# Patient Record
Sex: Male | Born: 1965 | Race: Black or African American | Hispanic: No | Marital: Single | Smoking: Never smoker
Health system: Southern US, Community
[De-identification: ages and names within clinical notes are randomized; demographics above are authoritative.]

## PROBLEM LIST (undated history)

## (undated) ENCOUNTER — Ambulatory Visit (HOSPITAL_COMMUNITY): Admission: EM

## (undated) ENCOUNTER — Emergency Department (HOSPITAL_COMMUNITY): Admission: EM | Payer: Self-pay

## (undated) DIAGNOSIS — N529 Male erectile dysfunction, unspecified: Secondary | ICD-10-CM

## (undated) DIAGNOSIS — M109 Gout, unspecified: Secondary | ICD-10-CM

## (undated) DIAGNOSIS — I10 Essential (primary) hypertension: Secondary | ICD-10-CM

## (undated) DIAGNOSIS — E785 Hyperlipidemia, unspecified: Secondary | ICD-10-CM

## (undated) HISTORY — PX: FOOT SURGERY: SHX648

## (undated) HISTORY — DX: Male erectile dysfunction, unspecified: N52.9

## (undated) HISTORY — PX: QUADRICEPS TENDON REPAIR: SHX756

## (undated) HISTORY — DX: Hyperlipidemia, unspecified: E78.5

## (undated) HISTORY — DX: Gout, unspecified: M10.9

---

## 2000-02-12 ENCOUNTER — Other Ambulatory Visit: Admission: RE | Admit: 2000-02-12 | Discharge: 2000-02-12 | Payer: Self-pay | Admitting: Podiatry

## 2007-01-25 ENCOUNTER — Ambulatory Visit (HOSPITAL_COMMUNITY): Admission: RE | Admit: 2007-01-25 | Discharge: 2007-01-25 | Payer: Self-pay | Admitting: Family Medicine

## 2007-01-25 ENCOUNTER — Emergency Department (HOSPITAL_COMMUNITY): Admission: EM | Admit: 2007-01-25 | Discharge: 2007-01-25 | Payer: Self-pay | Admitting: Family Medicine

## 2012-03-10 ENCOUNTER — Encounter (HOSPITAL_COMMUNITY): Payer: Self-pay | Admitting: *Deleted

## 2012-03-10 ENCOUNTER — Emergency Department (HOSPITAL_COMMUNITY)
Admission: EM | Admit: 2012-03-10 | Discharge: 2012-03-10 | Disposition: A | Discharge status: Home / Self Care | Payer: Managed Care, Other (non HMO) | Source: Home / Self Care | Attending: Emergency Medicine | Admitting: Emergency Medicine

## 2012-03-10 DIAGNOSIS — M109 Gout, unspecified: Secondary | ICD-10-CM

## 2012-03-10 HISTORY — DX: Essential (primary) hypertension: I10

## 2012-03-10 MED ORDER — INDOMETHACIN 50 MG PO CAPS: 50.0000 mg | ORAL_CAPSULE | Freq: Three times a day (TID) | ORAL | Status: DC

## 2012-03-10 NOTE — ED Provider Notes (Signed)
Chief Complaint  Patient presents with  . Foot Pain    History of Present Illness:   Gary Villanueva is a 46 year old male with an 8-9 year history of gout. His attacks occur rarely. He's not taking any long-term uric acid lowering medication. It's been years since his last attack. The current attack began yesterday and involves his right ankle. There is swelling but not much redness or heat. It's painful to touch and to move. He also has pain with ambulation. There were no obvious precipitating factors such as trauma or dietary indiscretion. He denies any fever, chills, or other joint pain. He has had good luck with indomethacin in the past.  He also has high blood pressure and ran out of his medication yesterday. He was not sure the name of the medication.  Review of Systems:  Other than noted above, the patient denies any of the following symptoms: Systemic:  No fevers, chills, sweats, or aches.  No fatigue or tiredness. Musculoskeletal:  No joint pain, arthritis, bursitis, swelling, back pain, or neck pain. Neurological:  No muscular weakness, paresthesias, headache, or trouble with speech or coordination.  No dizziness.  PMFSH:  Past medical history, family history, social history, meds, and allergies were reviewed.  Physical Exam:   Vital signs:  BP 156/88  Pulse 70  Temp 98.6 F (37 C) (Oral)  Resp 20  SpO2 100% Gen:  Alert and oriented times 3.  In no distress. Musculoskeletal: The right ankle was slightly swollen and tender to palpation but there was no redness or heat. There was pain on movement but a full range of motion. Otherwise, all joints had a full a ROM with no swelling, bruising or deformity.  No edema, pulses full. Extremities were warm and pink.  Capillary refill was brisk.  Skin:  Clear, warm and dry.  No rash. Neuro:  Alert and oriented times 3.  Muscle strength was normal.  Sensation was intact to light touch.   Assessment:  The primary encounter diagnosis was Gout attack.  A diagnosis of Elevated blood pressure was also pertinent to this visit.  Plan:   1.  The following meds were prescribed:   New Prescriptions   INDOMETHACIN (INDOCIN) 50 MG CAPSULE    Take 1 capsule (50 mg total) by mouth 3 (three) times daily with meals.   2.  The patient was instructed in symptomatic care, including rest and activity, elevation, application of ice and compression.  Appropriate handouts were given. 3.  The patient was told to return if becoming worse in any way, if no better in 3 or 4 days, and given some red flag symptoms that would indicate earlier return.   4.  The patient was told to follow up with his primary care practice for a refill on his blood pressure medication.    Reuben Likes, MD 03/10/12 [phone]

## 2012-03-10 NOTE — ED Notes (Signed)
Pt  Reports  Pain  r  Foot  He  Reports he  Has a  History  Of gout      -  He  denys  Any  Recent  Injury  - the  Pain is  Mainly in the  r  Ankle  Area    He  Is  Using  Crutches  At this  Time         He  Reports a  History of  Hypertension  yest he  Takes  No meds    For  It

## 2014-06-21 ENCOUNTER — Encounter (HOSPITAL_COMMUNITY): Payer: Self-pay | Admitting: Emergency Medicine

## 2014-06-21 ENCOUNTER — Emergency Department (HOSPITAL_COMMUNITY)
Admission: EM | Admit: 2014-06-21 | Discharge: 2014-06-21 | Disposition: A | Discharge status: Home / Self Care | Payer: Managed Care, Other (non HMO) | Source: Home / Self Care | Attending: Family Medicine | Admitting: Family Medicine

## 2014-06-21 DIAGNOSIS — I10 Essential (primary) hypertension: Secondary | ICD-10-CM | POA: Diagnosis not present

## 2014-06-21 MED ORDER — LISINOPRIL-HYDROCHLOROTHIAZIDE 10-12.5 MG PO TABS: 1.0000 | ORAL_TABLET | Freq: Every day | ORAL | Status: DC

## 2014-06-21 NOTE — ED Notes (Signed)
Reports BP has been high lately BP today = 169/100; has not had BP med 2-3 months Sx today include: dizziness and feeling light headed Denies CP, SOB, weakness Alert, no signs of acute distress.

## 2014-06-21 NOTE — ED Provider Notes (Signed)
CSN: [id number]     Arrival date & time 06/21/14  1609 History   First MD Initiated Contact with Patient 06/21/14 1742     Chief Complaint  Patient presents with  . Hypertension   (Consider location/radiation/quality/duration/timing/severity/associated sxs/prior Treatment) Patient is a 49 y.o. male presenting with hypertension. The history is provided by the patient.  Hypertension This is a chronic (on meds for approx 7 mo.) problem. Pertinent negatives include no chest pain, no abdominal pain, no headaches and no shortness of breath.    Past Medical History  Diagnosis Date  . Hypertension    Past Surgical History  Procedure Laterality Date  . Foot surgery     No family history on file. History  Substance Use Topics  . Smoking status: Not on file  . Smokeless tobacco: Not on file  . Alcohol Use: Yes    Review of Systems  Constitutional: Negative.   Respiratory: Negative for shortness of breath.   Cardiovascular: Negative for chest pain and leg swelling.  Gastrointestinal: Negative for abdominal pain.  Neurological: Negative for headaches.    Allergies  Review of patient's allergies indicates no known allergies.  Home Medications   Prior to Admission medications   Medication Sig Start Date End Date Taking? Authorizing Provider  indomethacin (INDOCIN) 50 MG capsule Take 1 capsule (50 mg total) by mouth 3 (three) times daily with meals. 03/10/12   Reuben Likesavid C Keller, MD  lisinopril-hydrochlorothiazide (PRINZIDE,ZESTORETIC) 10-12.5 MG per tablet Take 1 tablet by mouth daily. 06/21/14   Linna HoffJames D Curley Hogen, MD   BP 169/100 mmHg  Pulse 61  Temp(Src) 97.9 F (36.6 C) (Oral)  Resp 18  SpO2 96% Physical Exam  Constitutional: He is oriented to person, place, and time. He appears well-developed and well-nourished. No distress.  HENT:  Head: Normocephalic.  Eyes: Conjunctivae are normal. Pupils are equal, round, and reactive to light.  Neck: Normal range of motion. Neck supple.   Cardiovascular: Normal rate, regular rhythm, normal heart sounds and intact distal pulses.   Pulmonary/Chest: Effort normal and breath sounds normal.  Abdominal: Soft. Bowel sounds are normal.  Lymphadenopathy:    He has no cervical adenopathy.  Neurological: He is alert and oriented to person, place, and time.  Skin: Skin is warm and dry.  Nursing note and vitals reviewed.   ED Course  Procedures (including critical care time) Labs Review Labs Reviewed - No data to display  Imaging Review No results found.   MDM   1. Essential hypertension        Linna HoffJames D Denorris Reust, MD 06/21/14 1754

## 2014-06-21 NOTE — Discharge Instructions (Signed)
Take pill daily and see your doctor for further refills.

## 2015-09-27 ENCOUNTER — Ambulatory Visit (HOSPITAL_COMMUNITY)
Admission: EM | Admit: 2015-09-27 | Discharge: 2015-09-27 | Disposition: A | Discharge status: Home / Self Care | Payer: Managed Care, Other (non HMO) | Attending: Family Medicine | Admitting: Family Medicine

## 2015-09-27 ENCOUNTER — Encounter (HOSPITAL_COMMUNITY): Payer: Self-pay

## 2015-09-27 DIAGNOSIS — I10 Essential (primary) hypertension: Secondary | ICD-10-CM | POA: Diagnosis not present

## 2015-09-27 DIAGNOSIS — Z79899 Other long term (current) drug therapy: Secondary | ICD-10-CM | POA: Diagnosis not present

## 2015-09-27 DIAGNOSIS — Z202 Contact with and (suspected) exposure to infections with a predominantly sexual mode of transmission: Secondary | ICD-10-CM | POA: Diagnosis not present

## 2015-09-27 DIAGNOSIS — Z711 Person with feared health complaint in whom no diagnosis is made: Secondary | ICD-10-CM

## 2015-09-27 LAB — POCT URINALYSIS DIP (DEVICE)
Bilirubin Urine: NEGATIVE
Glucose, UA: NEGATIVE mg/dL
HGB URINE DIPSTICK: NEGATIVE
KETONES UR: NEGATIVE mg/dL
Leukocytes, UA: NEGATIVE
Nitrite: NEGATIVE
PH: 5.5 (ref 5.0–8.0)
PROTEIN: NEGATIVE mg/dL
SPECIFIC GRAVITY, URINE: 1.02 (ref 1.005–1.030)
Urobilinogen, UA: 0.2 mg/dL (ref 0.0–1.0)

## 2015-09-27 MED ORDER — METRONIDAZOLE 500 MG PO TABS: 500.0000 mg | ORAL_TABLET | Freq: Two times a day (BID) | ORAL | Status: DC

## 2015-09-27 NOTE — Discharge Instructions (Signed)
Use medicine as prescribed if desired , we will call if tests show a need for other treatment.

## 2015-09-27 NOTE — ED Provider Notes (Signed)
CSN: [id number]     Arrival date & time 09/27/15  1525 History   First MD Initiated Contact with Patient 09/27/15 1559     Chief Complaint  Patient presents with  . Exposure to STD   (Consider location/radiation/quality/duration/timing/severity/associated sxs/prior Treatment) Patient is a 50 y.o. male presenting with STD exposure. The history is provided by the patient.  Exposure to STD This is a new problem. The current episode started 2 days ago (advised of poss trich by male contact. pt denies any sx.). The problem has not changed since onset.Pertinent negatives include no abdominal pain.    Past Medical History  Diagnosis Date  . Hypertension    Past Surgical History  Procedure Laterality Date  . Foot surgery     No family history on file. Social History  Substance Use Topics  . Smoking status: Never Smoker   . Smokeless tobacco: Never Used  . Alcohol Use: Yes     Comment: occ    Review of Systems  Constitutional: Negative.   Gastrointestinal: Negative.  Negative for abdominal pain.  Genitourinary: Negative for dysuria, frequency, discharge, penile swelling, scrotal swelling, penile pain and testicular pain.  All other systems reviewed and are negative.   Allergies  Review of patient's allergies indicates no known allergies.  Home Medications   Prior to Admission medications   Medication Sig Start Date End Date Taking? Authorizing Provider  lisinopril-hydrochlorothiazide (PRINZIDE,ZESTORETIC) 10-12.5 MG per tablet Take 1 tablet by mouth daily. 06/21/14  Yes Linna HoffJames D Kenley Rettinger, MD  indomethacin (INDOCIN) 50 MG capsule Take 1 capsule (50 mg total) by mouth 3 (three) times daily with meals. 03/10/12   Reuben Likesavid C Keller, MD  metroNIDAZOLE (FLAGYL) 500 MG tablet Take 1 tablet (500 mg total) by mouth 2 (two) times daily. 09/27/15   Linna HoffJames D Merie Wulf, MD   Meds Ordered and Administered this Visit  Medications - No data to display  BP 148/88 mmHg  Pulse 67  Temp(Src) 98.8 F  (37.1 C) (Oral)  Resp 12  SpO2 99% No data found.   Physical Exam  Constitutional: He is oriented to person, place, and time. He appears well-developed and well-nourished.  Abdominal: Soft. Bowel sounds are normal. There is no tenderness.  Genitourinary: Penis normal.  Neurological: He is alert and oriented to person, place, and time.  Skin: Skin is warm and dry.  Nursing note and vitals reviewed.   ED Course  Procedures (including critical care time)  Labs Review Labs Reviewed  POCT URINALYSIS DIP (DEVICE)  URINE CYTOLOGY ANCILLARY ONLY    Imaging Review No results found.   Visual Acuity Review  Right Eye Distance:   Left Eye Distance:   Bilateral Distance:    Right Eye Near:   Left Eye Near:    Bilateral Near:         MDM   1. Concern about STD in male without diagnosis        Linna HoffJames D Man Effertz, MD 09/27/15 [phone]

## 2015-09-27 NOTE — ED Notes (Signed)
Patient would like to be checked for STD, states he got a phone call from a sexual partner stating she has been tested positive and he should be checked  No acute distress

## 2015-09-28 LAB — URINE CYTOLOGY ANCILLARY ONLY
CHLAMYDIA, DNA PROBE: NEGATIVE
NEISSERIA GONORRHEA: NEGATIVE
TRICH (WINDOWPATH): NEGATIVE

## 2015-11-24 ENCOUNTER — Other Ambulatory Visit: Payer: Self-pay | Admitting: Physician Assistant

## 2015-11-24 ENCOUNTER — Ambulatory Visit
Admission: RE | Admit: 2015-11-24 | Discharge: 2015-11-24 | Disposition: A | Discharge status: Home / Self Care | Payer: Managed Care, Other (non HMO) | Source: Ambulatory Visit | Attending: Physician Assistant | Admitting: Physician Assistant

## 2015-11-24 DIAGNOSIS — R52 Pain, unspecified: Secondary | ICD-10-CM

## 2018-03-04 ENCOUNTER — Encounter: Payer: Self-pay | Admitting: Nurse Practitioner

## 2018-03-04 ENCOUNTER — Ambulatory Visit: Payer: Self-pay | Attending: Nurse Practitioner | Admitting: Nurse Practitioner

## 2018-03-04 ENCOUNTER — Other Ambulatory Visit: Payer: Self-pay

## 2018-03-04 VITALS — BP 137/83 | HR 75 | Temp 98.6°F | Resp 16 | Ht 74.25 in | Wt 243.0 lb

## 2018-03-04 DIAGNOSIS — M1A09X0 Idiopathic chronic gout, multiple sites, without tophus (tophi): Secondary | ICD-10-CM | POA: Insufficient documentation

## 2018-03-04 DIAGNOSIS — Z1211 Encounter for screening for malignant neoplasm of colon: Secondary | ICD-10-CM | POA: Insufficient documentation

## 2018-03-04 DIAGNOSIS — Z792 Long term (current) use of antibiotics: Secondary | ICD-10-CM | POA: Insufficient documentation

## 2018-03-04 DIAGNOSIS — Z79899 Other long term (current) drug therapy: Secondary | ICD-10-CM | POA: Insufficient documentation

## 2018-03-04 DIAGNOSIS — I10 Essential (primary) hypertension: Secondary | ICD-10-CM | POA: Insufficient documentation

## 2018-03-04 NOTE — Progress Notes (Signed)
Assessment & Plan:  Gary Villanueva was seen today for new patient (initial visit).  Diagnoses and all orders for this visit:  Essential hypertension -     CBC -     CMP14+EGFR -     Lipid panel Continue all antihypertensives as prescribed.  Remember to bring in your blood pressure log with you for your follow up appointment.  DASH/Mediterranean Diets are healthier choices for HTN.    Colon cancer screening -     Fecal occult blood, imunochemical([facility])  Idiopathic chronic gout of multiple sites without tophus -     Uric Acid    Patient has been counseled on age-appropriate routine health concerns for screening and prevention. These are reviewed and up-to-date. Referrals have been placed accordingly. Immunizations are up-to-date or declined.    Subjective:   Chief Complaint  Patient presents with  . New Patient (Initial Visit)   HPI Gary Villanueva 52 y.o. male presents to office today to establish care. He has a history of HTN and GOUT. He is currently not taking any medication for his gout and denies any recent exacerbation or flare.   Essential Hypertension Blood pressure is well controlled. He does not monitor his blood pressure at home. He denies chest pain, persistent cough, shortness of breath, palpitations, lightheadedness, dizziness, headaches or BLE edema. He is currently taking lisinopril 91m daily.  BP Readings from Last 3 Encounters:  03/04/18 137/83  09/27/15 148/88  06/21/14 169/100    Gout He has a chronic history of LE gout. The patient reports no acute gout attacks in several years. Patient reports his chronic pain is stable, his joint stiffness is stable and his joint swelling is stable. Limitation on activities include none. The patient is not avoiding high purine foods. He has taken indocin in the past.    Review of Systems  Constitutional: Negative for fever, malaise/fatigue and weight loss.  HENT: Negative.  Negative for nosebleeds.     Eyes: Negative.  Negative for blurred vision, double vision and photophobia.  Respiratory: Negative.  Negative for cough and shortness of breath.   Cardiovascular: Negative.  Negative for chest pain, palpitations and leg swelling.  Gastrointestinal: Negative.  Negative for heartburn, nausea and vomiting.  Musculoskeletal: Negative.  Negative for myalgias.  Neurological: Negative.  Negative for dizziness, focal weakness, seizures and headaches.  Psychiatric/Behavioral: Negative.  Negative for suicidal ideas.    Past Medical History:  Diagnosis Date  . Gout   . Hypertension     Past Surgical History:  Procedure Laterality Date  . FOOT SURGERY    . QUADRICEPS TENDON REPAIR      Family History  Problem Relation Age of Onset  . Cancer Mother     Social History Reviewed with no changes to be made today.   Outpatient Medications Prior to Visit  Medication Sig Dispense Refill  . lisinopril (PRINIVIL,ZESTRIL) 20 MG tablet Take 20 mg by mouth daily.    . indomethacin (INDOCIN) 50 MG capsule Take 1 capsule (50 mg total) by mouth 3 (three) times daily with meals. 30 capsule 1  . lisinopril-hydrochlorothiazide (PRINZIDE,ZESTORETIC) 10-12.5 MG per tablet Take 1 tablet by mouth daily. (Patient not taking: Reported on 03/04/2018) 30 tablet 1  . metroNIDAZOLE (FLAGYL) 500 MG tablet Take 1 tablet (500 mg total) by mouth 2 (two) times daily. 14 tablet 0   No facility-administered medications prior to visit.     No Known Allergies     Objective:    BP 137/83 (BP  Location: Left Arm, Patient Position: Sitting, Cuff Size: Large)   Pulse 75   Temp 98.6 F (37 C)   Resp 16   Ht 6' 2.25" (1.886 m)   Wt 243 lb (110.2 kg)   SpO2 98%   BMI 30.99 kg/m  Wt Readings from Last 3 Encounters:  03/04/18 243 lb (110.2 kg)    Physical Exam  Constitutional: He is oriented to person, place, and time. He appears well-developed and well-nourished. He is cooperative.  HENT:  Head: Normocephalic and  atraumatic.  Eyes: EOM are normal.  Neck: Normal range of motion.  Cardiovascular: Normal rate, regular rhythm and normal heart sounds. Exam reveals no gallop and no friction rub.  No murmur heard. Pulmonary/Chest: Effort normal and breath sounds normal. No tachypnea. No respiratory distress. He has no decreased breath sounds. He has no wheezes. He has no rhonchi. He has no rales. He exhibits no tenderness.  Abdominal: Bowel sounds are normal.  Musculoskeletal: Normal range of motion. He exhibits no edema, tenderness or deformity.  Neurological: He is alert and oriented to person, place, and time. Coordination normal.  Skin: Skin is warm and dry.  Psychiatric: He has a normal mood and affect. His behavior is normal. Judgment and thought content normal.  Nursing note and vitals reviewed.        Patient has been counseled extensively about nutrition and exercise as well as the importance of adherence with medications and regular follow-up. The patient was given clear instructions to go to ER or return to medical center if symptoms don't improve, worsen or new problems develop. The patient verbalized understanding.   Follow-up: Return in about 3 months (around 06/03/2018) for HTN.   [name], FNP-BC [facility] and [facility] [place], [place]   03/07/2018, 12:36 AM

## 2018-03-05 LAB — LIPID PANEL
CHOLESTEROL TOTAL: 287 mg/dL — AB (ref 100–199)
Chol/HDL Ratio: 3.8 ratio (ref 0.0–5.0)
HDL: 76 mg/dL (ref >39)
LDL Calculated: 170 mg/dL — ABNORMAL HIGH (ref 0–99)
TRIGLYCERIDES: 207 mg/dL — AB (ref 0–149)
VLDL CHOLESTEROL CAL: 41 mg/dL — AB (ref 5–40)

## 2018-03-05 LAB — CMP14+EGFR
A/G RATIO: 1.6 (ref 1.2–2.2)
ALBUMIN: 4.3 g/dL (ref 3.5–5.5)
ALT: 50 IU/L — AB (ref 0–44)
AST: 32 IU/L (ref 0–40)
Alkaline Phosphatase: 42 IU/L (ref 39–117)
BILIRUBIN TOTAL: 0.3 mg/dL (ref 0.0–1.2)
BUN / CREAT RATIO: 15 (ref 9–20)
BUN: 20 mg/dL (ref 6–24)
CALCIUM: 9.7 mg/dL (ref 8.7–10.2)
CHLORIDE: 102 mmol/L (ref 96–106)
CO2: 20 mmol/L (ref 20–29)
Creatinine, Ser: 1.35 mg/dL — ABNORMAL HIGH (ref 0.76–1.27)
GFR, EST AFRICAN AMERICAN: 69 mL/min/{1.73_m2} (ref >59)
GFR, EST NON AFRICAN AMERICAN: 60 mL/min/{1.73_m2} (ref >59)
Globulin, Total: 2.7 g/dL (ref 1.5–4.5)
Glucose: 102 mg/dL — ABNORMAL HIGH (ref 65–99)
POTASSIUM: 4.8 mmol/L (ref 3.5–5.2)
Sodium: 140 mmol/L (ref 134–144)
TOTAL PROTEIN: 7 g/dL (ref 6.0–8.5)

## 2018-03-05 LAB — CBC
HEMOGLOBIN: 13.4 g/dL (ref 13.0–17.7)
Hematocrit: 39.2 % (ref 37.5–51.0)
MCH: 28.3 pg (ref 26.6–33.0)
MCHC: 34.2 g/dL (ref 31.5–35.7)
MCV: 83 fL (ref 79–97)
Platelets: 160 10*3/uL (ref 150–450)
RBC: 4.74 x10E6/uL (ref 4.14–5.80)
RDW: 12.6 % (ref 12.3–15.4)
WBC: 4.3 10*3/uL (ref 3.4–10.8)

## 2018-03-05 LAB — URIC ACID: Uric Acid: 9.8 mg/dL — ABNORMAL HIGH (ref 3.7–8.6)

## 2018-03-07 MED ORDER — ALLOPURINOL 100 MG PO TABS: 100.0000 mg | ORAL_TABLET | Freq: Every day | ORAL | 6 refills | Status: DC

## 2018-03-07 MED ORDER — ATORVASTATIN CALCIUM 20 MG PO TABS: 20.0000 mg | ORAL_TABLET | Freq: Every day | ORAL | 3 refills | Status: DC

## 2018-03-11 LAB — FECAL OCCULT BLOOD, IMMUNOCHEMICAL: Fecal Occult Bld: NEGATIVE

## 2018-03-12 MED FILL — ALLOPURINOL 100 MG TABLET: 100 | 30 days supply | Qty: 30 | Fill #0

## 2018-03-12 MED FILL — ATORVASTATIN 20 MG TABLET: 20 | 30 days supply | Qty: 30 | Fill #0

## 2018-03-13 ENCOUNTER — Telehealth (INDEPENDENT_AMBULATORY_CARE_PROVIDER_SITE_OTHER): Payer: Self-pay

## 2018-03-13 NOTE — Telephone Encounter (Signed)
Patient is aware that stool is negative for blood. Maryjean Mornempestt S Roberts, CMA

## 2018-03-13 NOTE — Telephone Encounter (Signed)
-----   Message from Claiborne RiggZelda W Fleming, NP sent at 03/11/2018 11:22 PM EST ----- Stool test is negative.

## 2018-03-26 ENCOUNTER — Encounter: Payer: Self-pay | Admitting: Family Medicine

## 2018-03-26 ENCOUNTER — Other Ambulatory Visit: Payer: Self-pay

## 2018-03-26 ENCOUNTER — Ambulatory Visit: Payer: Self-pay | Attending: Family Medicine | Admitting: Family Medicine

## 2018-03-26 VITALS — BP 181/113 | HR 88 | Temp 98.3°F | Resp 20 | Wt 234.0 lb

## 2018-03-26 DIAGNOSIS — M109 Gout, unspecified: Secondary | ICD-10-CM

## 2018-03-26 DIAGNOSIS — M25432 Effusion, left wrist: Secondary | ICD-10-CM

## 2018-03-26 DIAGNOSIS — M25532 Pain in left wrist: Secondary | ICD-10-CM

## 2018-03-26 DIAGNOSIS — I10 Essential (primary) hypertension: Secondary | ICD-10-CM | POA: Insufficient documentation

## 2018-03-26 DIAGNOSIS — R7989 Other specified abnormal findings of blood chemistry: Secondary | ICD-10-CM | POA: Insufficient documentation

## 2018-03-26 MED ORDER — INDOMETHACIN 25 MG PO CAPS: 25.0000 mg | ORAL_CAPSULE | Freq: Two times a day (BID) | ORAL | 0 refills | Status: DC

## 2018-03-26 MED ORDER — PREDNISONE 20 MG PO TABS: ORAL_TABLET | ORAL | 0 refills | Status: DC

## 2018-03-26 MED ORDER — CEPHALEXIN 500 MG PO CAPS: 500.0000 mg | ORAL_CAPSULE | Freq: Three times a day (TID) | ORAL | 0 refills | Status: AC

## 2018-03-26 MED ORDER — METHYLPREDNISOLONE ACETATE 40 MG/ML IJ SUSP
40.0000 mg | Freq: Once | INTRAMUSCULAR | Status: AC
  Administered 2018-03-26: 80 mg via INTRAMUSCULAR

## 2018-03-26 MED ORDER — METHYLPREDNISOLONE ACETATE 80 MG/ML IJ SUSP: 80.0000 mg | Freq: Once | INTRAMUSCULAR | Status: DC

## 2018-03-26 NOTE — Progress Notes (Signed)
Swelling and pain in left wrist x 3 days Pain 10/10

## 2018-03-26 NOTE — Patient Instructions (Signed)
Please return tomorrow if you have any increase in pain, swelling or area of redness increases.  Otherwise, please take the medication as prescribed.  You do not have to start the prednisone until tomorrow as you were given an injection of a steroid at today's visit.  You may start the use of the indomethacin 25 mg twice daily as needed for pain over the next 10 days.  Make sure that she do have eaten prior to taking the prednisone as well as the indomethacin to help avoid stomach upset.  Please follow-up next week if the pain has not completely subsided/gone away.  Please keep your regularly scheduled appointment with your primary care provider but return sooner if you are having any issues.

## 2018-03-26 NOTE — Progress Notes (Signed)
Subjective:    Patient ID: Gary Villanueva, male    DOB: 08-Apr-1965, 52 y.o.   MRN: [id number]  HPI       52 yo male who is seen secondary to complaint of 3-day onset of swelling and pain in the left wrist along with increased warmth and redness.  Patient states that he had a family member look for possible bug bite area on his arm.  Patient denies any injury to the arm.  Patient does have history of gout but states that this normally occurs in his right knee.  Patient states that he continues to take allopurinol.  Patient states that he did not take his lisinopril this morning because he took a dose of ibuprofen to help with his wrist pain and he was not sure that he should take both medications.  Patient reports that the pain is a 10 on a 0-to-10 scale with 10 being the worst pain possible.  Pain is currently constant and is a hard, aching pain.  Patient denies any fever or chills.  No shortness of breath, no chest pain, no headache or dizziness. Past Medical History:  Diagnosis Date  . Gout   . Hypertension    Past Surgical History:  Procedure Laterality Date  . FOOT SURGERY    . QUADRICEPS TENDON REPAIR     Family History  Problem Relation Age of Onset  . Cancer Mother    Social History   Tobacco Use  . Smoking status: Never Smoker  . Smokeless tobacco: Never Used  Substance Use Topics  . Alcohol use: Yes    Alcohol/week: 2.0 standard drinks    Types: 2 Cans of beer per week    Comment: occ  . Drug use: No  No Known Allergies   Review of Systems  Constitutional: Positive for fatigue. Negative for chills and fever.  Respiratory: Negative for cough and shortness of breath.   Cardiovascular: Negative for chest pain, palpitations and leg swelling.  Gastrointestinal: Negative for abdominal pain and nausea.  Musculoskeletal: Positive for arthralgias and joint swelling.  Skin: Positive for color change. Negative for wound.  Neurological: Negative for dizziness and headaches.    Hematological: Negative for adenopathy. Does not bruise/bleed easily.       Objective:   Physical Exam BP (!) 181/113 (BP Location: Right Arm, Patient Position: Sitting, Cuff Size: Large)   Pulse 88   Temp 98.3 F (36.8 C) (Oral)   Resp 20   Wt 234 lb (106.1 kg)   SpO2 97%   BMI 29.84 kg/m Nurses note and vital signs reviewed General-well-nourished, well-developed overweight for height male in no acute distress sitting in exam chair.  Patient with some visible redness and swelling of the left wrist/forearm Lungs-clear to auscultation bilaterally Cardiovascular-regular rate and rhythm Musculoskeletal- patient with some tenderness to palpation along the dorsum of the left wrist with increased tenderness over the radial side of the wrist but patient with generalized swelling, slight reddish color to the skin and increased warmth over the left wrist and proximal forearm      Assessment & Plan:  1. Pain and swelling of left wrist Patient reports onset x3 days of pain and swelling in the left wrist.  Patient states that he does have a history of gout but has never gotten gout in the left wrist.  Patient states that his gout flareups tend to occur in the left knee.  Patient is taking his allopurinol daily.  Patient denies any injury to  the left wrist.  Patient does have some tenderness over the left radial side of the wrist however his level of tenderness with palpation is somewhat inconsistent with the extreme tenderness to palpation that is usually associated with gout.  Therefore patient will be treated for gout but also treated for the possibility of cellulitis though I could not find any area of broken skin/disruption of the skin surface.  Patient is given injection of Depo-Medrol 80 mg to help with pain and swelling but will also be placed on Keflex 500 mg 3 times daily x7 days in case of cellulitis.  Patient was told that if he notices any extension of the redness or streaking of the redness  on his arm/forearm that he needs to return for medical evaluation/seek medical follow-up.  Patient should otherwise return to clinic next week if the pain/swelling and redness have not completely resolved. - methylPREDNISolone acetate (DEPO-MEDROL) injection 80 mg - predniSONE (DELTASONE) 20 MG tablet; On 03/27/18, please take 2 pills once per day for 2 days then 1 pill x 2 days then 1/2 pill x 2 days; take after eating  Dispense: 7 tablet; Refill: 0 - cephALEXin (KEFLEX) 500 MG capsule; Take 1 capsule (500 mg total) by mouth 3 (three) times daily for 7 days.  Dispense: 21 capsule; Refill: 0 - indomethacin (INDOCIN) 25 MG capsule; Take 1 capsule (25 mg total) by mouth 2 (two) times daily with a meal. As needed for pain  Dispense: 20 capsule; Refill: 0  2. Acute gout of multiple sites, unspecified cause Patient with history of gout, usually in the right knee but patient with 3-day onset of pain in the left wrist that may also be gout related.  Patient is being placed on prednisone taper which she will start tomorrow as he received Depo-Medrol 80 mg IM at today's visit.  Patient also given low-dose indomethacin 25 mg twice daily x10 days to take after eating.  Patient did have elevated creatinine of 1.35 on recent labs.  Patient also had elevated blood pressure today's visit but he did not take his blood pressure medication prior to today's visit. - methylPREDNISolone acetate (DEPO-MEDROL) injection 80 mg - predniSONE (DELTASONE) 20 MG tablet; On 03/27/18, please take 2 pills once per day for 2 days then 1 pill x 2 days then 1/2 pill x 2 days; take after eating  Dispense: 7 tablet; Refill: 0 - indomethacin (INDOCIN) 25 MG capsule; Take 1 capsule (25 mg total) by mouth 2 (two) times daily with a meal. As needed for pain  Dispense: 20 capsule; Refill: 0  An After Visit Summary was printed and given to the patient.  Return if symptoms worsen or fail to improve, for tomorrow if worse or not better; 1 week if  continued pain.  BP was still elevated on recheck and patient was asked to take his blood pressure medication when he gets home and if possible return tomorrow for a nurse visit for BP recheck

## 2018-05-14 ENCOUNTER — Other Ambulatory Visit: Payer: Self-pay

## 2018-05-14 ENCOUNTER — Encounter: Payer: Self-pay | Admitting: Family Medicine

## 2018-05-14 ENCOUNTER — Ambulatory Visit: Payer: Self-pay | Attending: Family Medicine | Admitting: Family Medicine

## 2018-05-14 VITALS — BP 133/80 | HR 70 | Temp 98.2°F | Ht 74.0 in

## 2018-05-14 DIAGNOSIS — M109 Gout, unspecified: Secondary | ICD-10-CM | POA: Insufficient documentation

## 2018-05-14 DIAGNOSIS — R0789 Other chest pain: Secondary | ICD-10-CM

## 2018-05-14 DIAGNOSIS — Z809 Family history of malignant neoplasm, unspecified: Secondary | ICD-10-CM | POA: Insufficient documentation

## 2018-05-14 DIAGNOSIS — Z7982 Long term (current) use of aspirin: Secondary | ICD-10-CM | POA: Insufficient documentation

## 2018-05-14 DIAGNOSIS — E785 Hyperlipidemia, unspecified: Secondary | ICD-10-CM | POA: Insufficient documentation

## 2018-05-14 DIAGNOSIS — Z7952 Long term (current) use of systemic steroids: Secondary | ICD-10-CM | POA: Insufficient documentation

## 2018-05-14 DIAGNOSIS — Z79899 Other long term (current) drug therapy: Secondary | ICD-10-CM | POA: Insufficient documentation

## 2018-05-14 DIAGNOSIS — I10 Essential (primary) hypertension: Secondary | ICD-10-CM | POA: Insufficient documentation

## 2018-05-14 DIAGNOSIS — R9431 Abnormal electrocardiogram [ECG] [EKG]: Secondary | ICD-10-CM

## 2018-05-14 MED ORDER — CYCLOBENZAPRINE HCL 10 MG PO TABS: 10.0000 mg | ORAL_TABLET | Freq: Two times a day (BID) | ORAL | 1 refills | Status: DC | PRN

## 2018-05-14 MED ORDER — NITROGLYCERIN 0.4 MG SL SUBL: 0.4000 mg | SUBLINGUAL_TABLET | SUBLINGUAL | 1 refills | Status: DC | PRN

## 2018-05-14 MED ORDER — ASPIRIN EC 81 MG PO TBEC: 81.0000 mg | DELAYED_RELEASE_TABLET | Freq: Every day | ORAL | 1 refills | Status: DC

## 2018-05-14 NOTE — Patient Instructions (Signed)

## 2018-05-14 NOTE — Progress Notes (Signed)
Patient complains of chest pain for a week and a half after working out. Patient denies radiation to the left arm, but states the left arm feels sore. Patient denies SOB OR fever. Patient has refrained from working out since the feeling started.

## 2018-05-14 NOTE — Progress Notes (Signed)
Subjective:  Patient ID: Gary PareMichael L Tellez, male    DOB: 01/08/1966  Age: 53 y.o. MRN: [id number]  CC: chest pain  HPI Gary Villanueva is a 53 year old male with hypertension, hyperlipidemia who walked into the clinic today complaining of chest pain and was triaged by the triage CMA. I was called to see him and he endorsed symptoms have been present only on exertion and started out at the gym when he was lifting weights.  He initially thought this was due to the fact that he had not exercised in several months. Described pain as parasternal radiating to bilateral shoulder region and feeling like a muscle pull but denies shortness of breath, diaphoresis, nausea or vomiting. Chest pain is absent at this time and absent at rest. Denies history of smoking.  Past Medical History:  Diagnosis Date  . Gout   . Hypertension     Past Surgical History:  Procedure Laterality Date  . FOOT SURGERY    . QUADRICEPS TENDON REPAIR      Family History  Problem Relation Age of Onset  . Cancer Mother     No Known Allergies  Outpatient Medications Prior to Visit  Medication Sig Dispense Refill  . allopurinol (ZYLOPRIM) 100 MG tablet Take 1 tablet (100 mg total) by mouth daily. 30 tablet 6  . atorvastatin (LIPITOR) 20 MG tablet Take 1 tablet (20 mg total) by mouth daily. 90 tablet 3  . indomethacin (INDOCIN) 25 MG capsule Take 1 capsule (25 mg total) by mouth 2 (two) times daily with a meal. As needed for pain 20 capsule 0  . lisinopril (PRINIVIL,ZESTRIL) 20 MG tablet Take 20 mg by mouth daily.    . predniSONE (DELTASONE) 20 MG tablet On 03/27/18, please take 2 pills once per day for 2 days then 1 pill x 2 days then 1/2 pill x 2 days; take after eating 7 tablet 0   No facility-administered medications prior to visit.      ROS Review of Systems General: negative for fever, weight loss, appetite change Eyes: no visual symptoms. ENT: no ear symptoms, no sinus tenderness, no nasal congestion or  sore throat. Neck: no pain  Respiratory: no wheezing, shortness of breath, cough Cardiovascular: no chest pain, no dyspnea on exertion, no pedal edema, no orthopnea. Gastrointestinal: no abdominal pain, no diarrhea, no constipation Genito-Urinary: no urinary frequency, no dysuria, no polyuria. Hematologic: no bruising Endocrine: no cold or heat intolerance Neurological: no headaches, no seizures, no tremors Musculoskeletal: no joint pains, no joint swelling Skin: no pruritus, no rash. Psychological: no depression, no anxiety,    Objective:  BP 133/80 (BP Location: Left Arm, Patient Position: Sitting, Cuff Size: Normal)   Pulse 70   Temp 98.2 F (36.8 C) (Oral)   Ht 6\' 2"  (1.88 m)   BMI 30.04 kg/m   BP/Weight 05/14/2018 03/26/2018 03/04/2018  Systolic BP 133 181 137  Diastolic BP 80 113 83  Wt. (Lbs) - 234 243  BMI 30.04 29.84 30.99      Physical Exam Constitutional: normal appearing,  Eyes: PERRLA HEENT: Head is atraumatic, normal sinuses, normal oropharynx, normal appearing tonsils and palate, tympanic membrane is normal bilaterally. Neck: normal range of motion, no thyromegaly, no JVD Cardiovascular: normal rate and rhythm, normal heart sounds, no murmurs, rub or gallop, no pedal edema Respiratory: Normal breath sounds, clear to auscultation bilaterally, no wheezes, no rales, no rhonchi Abdomen: soft, not tender to palpation, normal bowel sounds, no enlarged organs Musculoskeletal: Full ROM, no tenderness  in joints Skin: warm and dry, no lesions. Neurological: alert, oriented x3, cranial nerves I-XII grossly intact , normal motor strength, normal sensation. Psychological: normal mood.   CMP Latest Ref Rng & Units 03/04/2018  Glucose 65 - 99 mg/dL 347(Q)  BUN 6 - 24 mg/dL 20  Creatinine 2.59 - 5.63 mg/dL 8.75(I)  Sodium 433 - 295 mmol/L 140  Potassium 3.5 - 5.2 mmol/L 4.8  Chloride 96 - 106 mmol/L 102  CO2 20 - 29 mmol/L 20  Calcium 8.7 - 10.2 mg/dL 9.7  Total  Protein 6.0 - 8.5 g/dL 7.0  Total Bilirubin 0.0 - 1.2 mg/dL 0.3  Alkaline Phos 39 - 117 IU/L 42  AST 0 - 40 IU/L 32  ALT 0 - 44 IU/L 50(H)    Lipid Panel     Component Value Date/Time   CHOL 287 (H) 03/04/2018 1451   TRIG 207 (H) 03/04/2018 1451   HDL 76 03/04/2018 1451   CHOLHDL 3.8 03/04/2018 1451   LDLCALC 170 (H) 03/04/2018 1451    CBC    Component Value Date/Time   WBC 4.3 03/04/2018 1451   RBC 4.74 03/04/2018 1451   HGB 13.4 03/04/2018 1451   HCT 39.2 03/04/2018 1451   PLT 160 03/04/2018 1451   MCV 83 03/04/2018 1451   MCH 28.3 03/04/2018 1451   MCHC 34.2 03/04/2018 1451   RDW 12.6 03/04/2018 1451    The 10-year ASCVD risk score Denman George DC Jr., et al., 2013) is: 10.6%   Values used to calculate the score:     Age: 44 years     Sex: Male     Is Non-Hispanic African American: Yes     Diabetic: No     Tobacco smoker: No     Systolic Blood Pressure: 133 mmHg     Is BP treated: Yes     HDL Cholesterol: 76 mg/dL     Total Cholesterol: 287 mg/dL    Assessment & Plan:   1. Other chest pain Pain is absent at this time EKG reveals ST depression and T wave inversions in inferolateral leads We will need stress test; referred to cardiology Advised to report to the ED if he has chest pains - cyclobenzaprine (FLEXERIL) 10 MG tablet; Take 1 tablet (10 mg total) by mouth 2 (two) times daily as needed for muscle spasms.  Dispense: 40 tablet; Refill: 1 - aspirin EC 81 MG tablet; Take 1 tablet (81 mg total) by mouth daily.  Dispense: 30 tablet; Refill: 1 - nitroGLYCERIN (NITROSTAT) 0.4 MG SL tablet; Place 1 tablet (0.4 mg total) under the tongue every 5 (five) minutes as needed for chest pain. Call 911 if symptoms do not resolve after 3 doses  Dispense: 50 tablet; Refill: 1 - Ambulatory referral to Cardiology  2. Abnormal EKG See #1 above - Ambulatory referral to Cardiology   Meds ordered this encounter  Medications  . cyclobenzaprine (FLEXERIL) 10 MG tablet    Sig:  Take 1 tablet (10 mg total) by mouth 2 (two) times daily as needed for muscle spasms.    Dispense:  40 tablet    Refill:  1  . aspirin EC 81 MG tablet    Sig: Take 1 tablet (81 mg total) by mouth daily.    Dispense:  30 tablet    Refill:  1  . nitroGLYCERIN (NITROSTAT) 0.4 MG SL tablet    Sig: Place 1 tablet (0.4 mg total) under the tongue every 5 (five) minutes as needed for chest pain.  Call 911 if symptoms do not resolve after 3 doses    Dispense:  50 tablet    Refill:  1    Follow-up: Return for Follow-up of chronic medical conditions, keep previously scheduled appointment with PCP.       Hoy Register, MD, FAAFP. [facility] [place], [place] [phone]   05/14/2018, 1:04 PM

## 2018-05-21 ENCOUNTER — Ambulatory Visit: Payer: Self-pay | Admitting: Cardiology

## 2018-06-03 ENCOUNTER — Ambulatory Visit: Payer: Self-pay | Admitting: Nurse Practitioner

## 2018-06-08 ENCOUNTER — Ambulatory Visit: Payer: Self-pay | Attending: Nurse Practitioner | Admitting: Nurse Practitioner

## 2018-06-08 ENCOUNTER — Encounter: Payer: Self-pay | Admitting: Nurse Practitioner

## 2018-06-08 VITALS — BP 167/104 | HR 81 | Temp 99.0°F | Ht 74.5 in | Wt 241.2 lb

## 2018-06-08 DIAGNOSIS — E782 Mixed hyperlipidemia: Secondary | ICD-10-CM

## 2018-06-08 DIAGNOSIS — H8113 Benign paroxysmal vertigo, bilateral: Secondary | ICD-10-CM

## 2018-06-08 DIAGNOSIS — I10 Essential (primary) hypertension: Secondary | ICD-10-CM

## 2018-06-08 DIAGNOSIS — M1A09X0 Idiopathic chronic gout, multiple sites, without tophus (tophi): Secondary | ICD-10-CM

## 2018-06-08 MED ORDER — MECLIZINE HCL 25 MG PO TABS: 25.0000 mg | ORAL_TABLET | Freq: Three times a day (TID) | ORAL | 0 refills | Status: DC | PRN

## 2018-06-08 MED ORDER — LISINOPRIL 20 MG PO TABS: 20.0000 mg | ORAL_TABLET | Freq: Every day | ORAL | 1 refills | Status: DC

## 2018-06-08 MED ORDER — CLONIDINE HCL 0.1 MG PO TABS
0.1000 mg | ORAL_TABLET | Freq: Once | ORAL | Status: AC
  Administered 2018-06-08: 0.1 mg via ORAL

## 2018-06-08 MED FILL — LISINOPRIL 20 MG TAB: 20 | 30 days supply | Qty: 30 | Fill #0

## 2018-06-08 MED FILL — MECLIZINE 25 MG TABLET: 25 | 10 days supply | Qty: 30 | Fill #0

## 2018-06-08 NOTE — Progress Notes (Signed)
Assessment & Plan:  [name] was seen today for follow-up.  Diagnoses and all orders for this visit:  Essential hypertension -     cloNIDine (CATAPRES) tablet 0.1 mg -     CMP14+EGFR -     lisinopril (PRINIVIL,ZESTRIL) 20 MG tablet; Take 1 tablet (20 mg total) by mouth daily. Continue all antihypertensives as prescribed.  Remember to bring in your blood pressure log with you for your follow up appointment.  DASH/Mediterranean Diets are healthier choices for HTN.     Mixed hyperlipidemia -     Lipid panel INSTRUCTIONS: Work on a low fat, heart healthy diet and participate in regular aerobic exercise program by working out at least 150 minutes per week; 5 days a week-30 minutes per day. Avoid red meat, fried foods. junk foods, sodas, sugary drinks, unhealthy snacking, alcohol and smoking.  Drink at least 48oz of water per day and monitor your carbohydrate intake daily.   Chronic gout of multiple sites, unspecified cause -     Uric Acid  Benign paroxysmal positional vertigo due to bilateral vestibular disorder Chronic and well controlled.  -     meclizine (ANTIVERT) 25 MG tablet; Take 1 tablet (25 mg total) by mouth 3 (three) times daily as needed for dizziness.    Patient has been counseled on age-appropriate routine health concerns for screening and prevention. These are reviewed and up-to-date. Referrals have been placed accordingly. Immunizations are up-to-date or declined.    Subjective:   Chief Complaint  Patient presents with  . Follow-up    Pt. is here for follow-up on HTN.    HPI Gary Villanueva 53 y.o. male presents to office today for follow up to HTN, HPL, Gout and chronic intermittent BPV  Essential Hypertension Poorly controlled today. Required clonidine here in office. He ran out of his lisinopril 20 mg yesterday. Reports blood pressure readings at home 110/70-80s. He denies chest pain, shortness of breath, palpitations, lightheadedness, dizziness, headaches or  BLE edema. Will restart lisinopril 20 mg today.  BP Readings from Last 3 Encounters:  06/08/18 (!) 167/104  05/14/18 133/80  03/26/18 (!) 181/113   Hyperlipidemia Patient presents for follow up to hyperlipidemia.  He is medication compliant taking atorvastatin 20 mg daily. He is diet compliant and denies chest pain, exertional chest pressure/discomfort and lower extremity edema or statin intolerance including myalgias.  Lab Results  Component Value Date   CHOL 287 (H) 03/04/2018   Lab Results  Component Value Date   HDL 76 03/04/2018   Lab Results  Component Value Date   LDLCALC 170 (H) 03/04/2018   Lab Results  Component Value Date   TRIG 207 (H) 03/04/2018   Lab Results  Component Value Date   CHOLHDL 3.8 03/04/2018    Gout  Has not been taking allopurinol as prescribed. Ran out and did not refill. Last Uric acid level was elevated. Will repeat today and restart allopurinol 100 mg daily. He denies any joint pain today.  Lab Results  Component Value Date   LABURIC 9.8 (H) 03/04/2018     Review of Systems  Constitutional: Negative for fever, malaise/fatigue and weight loss.  HENT: Negative.  Negative for nosebleeds.   Eyes: Negative.  Negative for blurred vision, double vision and photophobia.  Respiratory: Negative.  Negative for cough and shortness of breath.   Cardiovascular: Negative.  Negative for chest pain, palpitations and leg swelling.  Gastrointestinal: Negative.  Negative for heartburn, nausea and vomiting.  Musculoskeletal: Positive for joint pain.  Negative for myalgias.  Neurological: Positive for dizziness (intermittent; history of BPV). Negative for focal weakness, seizures and headaches.  Psychiatric/Behavioral: Negative.  Negative for suicidal ideas.    Past Medical History:  Diagnosis Date  . Gout   . Hypertension     Past Surgical History:  Procedure Laterality Date  . FOOT SURGERY    . QUADRICEPS TENDON REPAIR      Family History    Problem Relation Age of Onset  . Cancer Mother     Social History Reviewed with no changes to be made today.   Outpatient Medications Prior to Visit  Medication Sig Dispense Refill  . allopurinol (ZYLOPRIM) 100 MG tablet Take 1 tablet (100 mg total) by mouth daily. 30 tablet 6  . aspirin EC 81 MG tablet Take 1 tablet (81 mg total) by mouth daily. 30 tablet 1  . cyclobenzaprine (FLEXERIL) 10 MG tablet Take 1 tablet (10 mg total) by mouth 2 (two) times daily as needed for muscle spasms. 40 tablet 1  . nitroGLYCERIN (NITROSTAT) 0.4 MG SL tablet Place 1 tablet (0.4 mg total) under the tongue every 5 (five) minutes as needed for chest pain. Call 911 if symptoms do not resolve after 3 doses 50 tablet 1  . lisinopril (PRINIVIL,ZESTRIL) 20 MG tablet Take 20 mg by mouth daily.    . predniSONE (DELTASONE) 20 MG tablet On 03/27/18, please take 2 pills once per day for 2 days then 1 pill x 2 days then 1/2 pill x 2 days; take after eating 7 tablet 0  . atorvastatin (LIPITOR) 20 MG tablet Take 1 tablet (20 mg total) by mouth daily. (Patient not taking: Reported on 06/08/2018) 90 tablet 3  . indomethacin (INDOCIN) 25 MG capsule Take 1 capsule (25 mg total) by mouth 2 (two) times daily with a meal. As needed for pain (Patient not taking: Reported on 06/08/2018) 20 capsule 0   No facility-administered medications prior to visit.     No Known Allergies     Objective:    BP (!) 167/104 (BP Location: Left Arm, Patient Position: Sitting, Cuff Size: Large)   Pulse 81   Temp 99 F (37.2 C) (Oral)   Ht 6' 2.5" (1.892 m)   Wt 241 lb 3.2 oz (109.4 kg)   SpO2 95%   BMI 30.55 kg/m  Wt Readings from Last 3 Encounters:  06/08/18 241 lb 3.2 oz (109.4 kg)  03/26/18 234 lb (106.1 kg)  03/04/18 243 lb (110.2 kg)    Physical Exam Vitals signs and nursing note reviewed.  Constitutional:      Appearance: He is well-developed.  HENT:     Head: Normocephalic and atraumatic.  Neck:     Musculoskeletal: Normal  range of motion.  Cardiovascular:     Rate and Rhythm: Normal rate and regular rhythm.     Heart sounds: Normal heart sounds. No murmur. No friction rub. No gallop.   Pulmonary:     Effort: Pulmonary effort is normal. No tachypnea or respiratory distress.     Breath sounds: Normal breath sounds. No decreased breath sounds, wheezing, rhonchi or rales.  Chest:     Chest wall: No tenderness.  Abdominal:     General: Bowel sounds are normal.     Palpations: Abdomen is soft.  Musculoskeletal: Normal range of motion.  Skin:    General: Skin is warm and dry.  Neurological:     Mental Status: He is alert and oriented to person, place, and time.  Sensory: Sensation is intact.     Motor: Motor function is intact.     Coordination: Coordination is intact. Romberg sign negative. Coordination normal.     Gait: Gait is intact.  Psychiatric:        Behavior: Behavior normal. Behavior is cooperative.        Thought Content: Thought content normal.        Judgment: Judgment normal.        Patient has been counseled extensively about nutrition and exercise as well as the importance of adherence with medications and regular follow-up. The patient was given clear instructions to go to ER or return to medical center if symptoms don't improve, worsen or new problems develop. The patient verbalized understanding.   Follow-up: Return in about 3 months (around 09/08/2018) for HTN.   [name], FNP-BC [facility] [place], [place]   06/08/2018, 2:24 PM

## 2018-06-09 LAB — CMP14+EGFR
ALT: 73 IU/L — ABNORMAL HIGH (ref 0–44)
AST: 69 IU/L — ABNORMAL HIGH (ref 0–40)
Albumin/Globulin Ratio: 1.8 (ref 1.2–2.2)
Albumin: 4.4 g/dL (ref 3.8–4.9)
Alkaline Phosphatase: 52 IU/L (ref 39–117)
BILIRUBIN TOTAL: 0.4 mg/dL (ref 0.0–1.2)
BUN / CREAT RATIO: 12 (ref 9–20)
BUN: 14 mg/dL (ref 6–24)
CO2: 24 mmol/L (ref 20–29)
CREATININE: 1.18 mg/dL (ref 0.76–1.27)
Calcium: 9.5 mg/dL (ref 8.7–10.2)
Chloride: 96 mmol/L (ref 96–106)
GFR, EST AFRICAN AMERICAN: 81 mL/min/{1.73_m2} (ref >59)
GFR, EST NON AFRICAN AMERICAN: 70 mL/min/{1.73_m2} (ref >59)
GLUCOSE: 106 mg/dL — AB (ref 65–99)
Globulin, Total: 2.5 g/dL (ref 1.5–4.5)
Potassium: 4.7 mmol/L (ref 3.5–5.2)
Sodium: 136 mmol/L (ref 134–144)
TOTAL PROTEIN: 6.9 g/dL (ref 6.0–8.5)

## 2018-06-09 LAB — LIPID PANEL
Chol/HDL Ratio: 3.4 ratio (ref 0.0–5.0)
Cholesterol, Total: 265 mg/dL — ABNORMAL HIGH (ref 100–199)
HDL: 78 mg/dL (ref >39)
LDL Calculated: 142 mg/dL — ABNORMAL HIGH (ref 0–99)
Triglycerides: 225 mg/dL — ABNORMAL HIGH (ref 0–149)
VLDL Cholesterol Cal: 45 mg/dL — ABNORMAL HIGH (ref 5–40)

## 2018-06-09 LAB — URIC ACID: Uric Acid: 9 mg/dL — ABNORMAL HIGH (ref 3.7–8.6)

## 2018-06-15 ENCOUNTER — Other Ambulatory Visit: Payer: Self-pay | Admitting: Nurse Practitioner

## 2018-06-15 ENCOUNTER — Telehealth: Payer: Self-pay

## 2018-06-15 MED ORDER — ALLOPURINOL 100 MG PO TABS: 200.0000 mg | ORAL_TABLET | Freq: Every day | ORAL | 1 refills | Status: DC

## 2018-06-15 MED ORDER — ATORVASTATIN CALCIUM 20 MG PO TABS: 20.0000 mg | ORAL_TABLET | Freq: Every day | ORAL | 3 refills | Status: DC

## 2018-06-15 NOTE — Telephone Encounter (Signed)
CMA spoke to patient to inform on results.  Pt. Verified DOB. Pt. Understood  Pt is aware to schedule a 6 weeks lab appt once he pick up the new dosage Rx for his Gout.  Pt. Is aware to fast at his OV with PCP.

## 2018-06-15 NOTE — Telephone Encounter (Signed)
-----   Message from Claiborne Rigg, NP sent at 06/15/2018  2:32 PM EDT ----- Uric acid levels are elevated as well as cholesterol levels. Will increase your allopurinol to 200 mg. Will need to make another lab appointment in 6 weeks to recheck your uric acid levels. Make sure you are taking your atorvastatin 20mg  daily. Will recheck your cholesterol levels at your next office visit. Make sure you do not eat any foods prior to your office visit. Will need to check levels fasting. You can take your medications but no food. INSTRUCTIONS: Work on a low fat, heart healthy diet and participate in regular aerobic exercise program by working out at least 150 minutes per week; 5 days a week-30 minutes per day. Avoid red meat, fried foods. junk foods, sodas, sugary drinks, unhealthy snacking, alcohol and smoking.  Drink at least 48oz of water per day and monitor your carbohydrate intake daily.

## 2018-06-17 ENCOUNTER — Other Ambulatory Visit: Payer: Self-pay

## 2018-06-17 ENCOUNTER — Ambulatory Visit: Payer: Self-pay | Attending: Family Medicine

## 2018-06-17 MED FILL — ALLOPURINOL 100 MG TABLET: 100 | 60 days supply | Qty: 120 | Fill #0

## 2018-06-17 MED FILL — ATORVASTATIN 20 MG TABLET: 20 | 90 days supply | Qty: 90 | Fill #0

## 2018-06-24 ENCOUNTER — Ambulatory Visit: Payer: Self-pay | Admitting: Cardiology

## 2018-07-01 ENCOUNTER — Telehealth: Payer: Self-pay | Admitting: *Deleted

## 2018-07-01 NOTE — Telephone Encounter (Signed)
-----   Message from Rollene Rotunda, MD sent at 06/30/2018  1:24 PM EDT ----- Jamey Ripa a telehealth visit Wed or Thursday.  Document if he does not want this.   ----- Message ----- From: Lorin Mercy Sent: 06/19/2018   2:29 PM EDT To: Rollene Rotunda, MD  Dr Antoine Poche this is a new patient scheduled to see you 3/25.  As per instructions I'm forwarding you his chart.  Corine Shelter PA-C 06/19/2018 2:30 PM

## 2018-07-01 NOTE — Telephone Encounter (Signed)
Leave message for pt to call back 

## 2018-07-31 ENCOUNTER — Telehealth: Payer: Self-pay | Admitting: Cardiology

## 2018-08-11 ENCOUNTER — Telehealth: Payer: Self-pay | Admitting: Cardiology

## 2018-08-11 NOTE — Progress Notes (Deleted)
{  Choose 1 Note Type (Telehealth Visit or Telephone Visit):[phone]}   Date:  08/11/2018   ID:  Gary Villanueva, DOB 11-Nov-1965, MRN [id number]  {Patient [phone]::"Home"} {Provider Location:[phone]::"Home"}  PCP:  Claiborne Rigg, NP  Cardiologist:  No primary care provider on file. *** Electrophysiologist:  None   Evaluation Performed:  New Patient Evaluation  Chief Complaint:  ***  History of Present Illness:    Gary Villanueva is a 53 y.o. male who is referred by *** for evaluation of chest pain.  ***    The patient {does/does not:200015} have symptoms concerning for COVID-19 infection (fever, chills, cough, or new shortness of breath).    Past Medical History:  Diagnosis Date  . Gout   . Hypertension    Past Surgical History:  Procedure Laterality Date  . FOOT SURGERY    . QUADRICEPS TENDON REPAIR       No outpatient medications have been marked as taking for the 08/12/18 encounter (Appointment) with Rollene Rotunda, MD.     Allergies:   Patient has no known allergies.   Social History   Tobacco Use  . Smoking status: Never Smoker  . Smokeless tobacco: Never Used  Substance Use Topics  . Alcohol use: Yes    Alcohol/week: 2.0 standard drinks    Types: 2 Cans of beer per week    Comment: occ  . Drug use: No     Family Hx: The patient's family history includes Cancer in his mother.  ROS:   Please see the history of present illness.    *** All other systems reviewed and are negative.   Prior CV studies:   The following studies were reviewed today:  ***  Labs/Other Tests and Data Reviewed:    EKG:  {EKG/Telemetry Strips [phone]}  Recent Labs: 03/04/2018: Hemoglobin 13.4; Platelets 160 06/08/2018: ALT 73; BUN 14; Creatinine, Ser 1.18; Potassium 4.7; Sodium 136   Recent Lipid Panel Lab Results  Component Value Date/Time   CHOL 265 (H) 06/08/2018 02:36 PM   TRIG 225 (H) 06/08/2018 02:36 PM   HDL 78 06/08/2018 02:36 PM    CHOLHDL 3.4 06/08/2018 02:36 PM   LDLCALC 142 (H) 06/08/2018 02:36 PM    Wt Readings from Last 3 Encounters:  06/08/18 241 lb 3.2 oz (109.4 kg)  03/26/18 234 lb (106.1 kg)  03/04/18 243 lb (110.2 kg)     Objective:    Vital Signs:  There were no vitals taken for this visit.   {[facility] Virtual Exam (Optional):[phone]::"VITAL SIGNS:  reviewed"}  ASSESSMENT & PLAN:    1. ***  COVID-19 Education: The signs and symptoms of COVID-19 were discussed with the patient and how to seek care for testing (follow up with PCP or arrange E-visit).  ***The importance of social distancing was discussed today.  Time:   Today, I have spent *** minutes with the patient with telehealth technology discussing the above problems.     Medication Adjustments/Labs and Tests Ordered: Current medicines are reviewed at length with the patient today.  Concerns regarding medicines are outlined above.   Tests Ordered: No orders of the defined types were placed in this encounter.   Medication Changes: No orders of the defined types were placed in this encounter.   Disposition:  Follow up {follow up:15908}  Signed, Rollene Rotunda, MD  08/11/2018 9:17 PM    [facility]

## 2018-08-12 ENCOUNTER — Telehealth: Payer: Self-pay | Admitting: Cardiology

## 2018-09-07 ENCOUNTER — Telehealth: Payer: Self-pay | Admitting: Nurse Practitioner

## 2018-09-07 DIAGNOSIS — I10 Essential (primary) hypertension: Secondary | ICD-10-CM

## 2018-09-07 MED ORDER — LISINOPRIL 20 MG PO TABS: 20.0000 mg | ORAL_TABLET | Freq: Every day | ORAL | 0 refills | Status: DC

## 2018-09-07 NOTE — Telephone Encounter (Signed)
1) Medication(s) Requested (by name): Lisinopril   2) Pharmacy of Choice: [facility] on [address].   3) Special Requests:   Approved medications will be sent to the pharmacy, we will reach out if there is an issue.  Requests made after 3pm may not be addressed until the following business day!  If a patient is unsure of the name of the medication(s) please note and ask patient to call back when they are able to provide all info, do not send to responsible party until all information is available!

## 2018-09-09 ENCOUNTER — Ambulatory Visit: Payer: Self-pay | Admitting: Nurse Practitioner

## 2018-09-11 ENCOUNTER — Other Ambulatory Visit: Payer: Self-pay

## 2018-09-11 ENCOUNTER — Ambulatory Visit: Payer: Self-pay | Attending: Nurse Practitioner | Admitting: Nurse Practitioner

## 2018-09-11 VITALS — BP 151/97 | HR 72 | Temp 98.4°F | Resp 1 | Wt 227.0 lb

## 2018-09-11 DIAGNOSIS — I10 Essential (primary) hypertension: Secondary | ICD-10-CM

## 2018-09-11 DIAGNOSIS — E782 Mixed hyperlipidemia: Secondary | ICD-10-CM

## 2018-09-11 DIAGNOSIS — M1A09X0 Idiopathic chronic gout, multiple sites, without tophus (tophi): Secondary | ICD-10-CM

## 2018-09-11 MED ORDER — ALLOPURINOL 100 MG PO TABS: 200.0000 mg | ORAL_TABLET | Freq: Every day | ORAL | 1 refills | Status: DC

## 2018-09-11 MED ORDER — ATORVASTATIN CALCIUM 20 MG PO TABS: 20.0000 mg | ORAL_TABLET | Freq: Every day | ORAL | 3 refills | Status: DC

## 2018-09-11 MED ORDER — AMLODIPINE BESYLATE 5 MG PO TABS: 5.0000 mg | ORAL_TABLET | Freq: Every day | ORAL | 3 refills | Status: DC

## 2018-09-11 NOTE — Progress Notes (Signed)
Assessment & Plan:  Diagnoses and all orders for this visit:  Essential hypertension -     CMP14+EGFR -     Uric Acid -     amLODipine (NORVASC) 5 MG tablet; Take 1 tablet (5 mg total) by mouth daily. Continue all antihypertensives as prescribed.  Remember to bring in your blood pressure log with you for your follow up appointment.  DASH/Mediterranean Diets are healthier choices for HTN.   Chronic gout of multiple sites, unspecified cause -     allopurinol (ZYLOPRIM) 100 MG tablet; Take 2 tablets (200 mg total) by mouth daily for 30 days.  Mixed hyperlipidemia -     atorvastatin (LIPITOR) 20 MG tablet; Take 1 tablet (20 mg total) by mouth daily. INSTRUCTIONS: Work on a low fat, heart healthy diet and participate in regular aerobic exercise program by working out at least 150 minutes per week; 5 days a week-30 minutes per day. Avoid red meat, fried foods. junk foods, sodas, sugary drinks, unhealthy snacking, alcohol and smoking.  Drink at least 48oz of water per day and monitor your carbohydrate intake daily.      Patient has been counseled on age-appropriate routine health concerns for screening and prevention. These are reviewed and up-to-date. Referrals have been placed accordingly. Immunizations are up-to-date or declined.    Subjective:   HPI Gary Villanueva 53 y.o. male presents to office today for follow up to HTN.   has a past medical history of Gout and Hypertension.  Essential Hypertension Chronic and poorly controlled. He recently purchased a blood pressure monitor but has not used it yet. As blood pressure is elevated will add amlodipine 5 mg to current lisinopril 93m. He ran out of his medications last week but has since resumed then earlier this week. Denies chest pain, shortness of breath, palpitations, lightheadedness, dizziness, headaches or BLE edema. States sometimes he does miss "a few" doses but is mainly medication compliant. he does endorse intermittent  tingling in his left hand. I have instructed him to check his blood pressure if there is another occurrence at home. He is currently asymptomatic.   BP Readings from Last 3 Encounters:  09/11/18 (!) 151/97  06/08/18 (!) 167/104  05/14/18 133/80    GOUT Had run out of his allopurinol over a month ago. Will refill today. Uric acid level not at goal however patient has not been medication compliant.  Lab Results  Component Value Date   LABURIC 6.7 09/11/2018    Hyperlipidemia Patient presents for follow up to hyperlipidemia.  He is not medication compliant taking atorvastatin 20 mg daily. He is not diet compliant and denies  statin intolerance including myalgias.  Lab Results  Component Value Date   CHOL 265 (H) 06/08/2018   Lab Results  Component Value Date   HDL 78 06/08/2018   Lab Results  Component Value Date   LDLCALC 142 (H) 06/08/2018   Lab Results  Component Value Date   TRIG 225 (H) 06/08/2018   Lab Results  Component Value Date   CHOLHDL 3.4 06/08/2018    Review of Systems  Constitutional: Negative for fever, malaise/fatigue and weight loss.  HENT: Negative.  Negative for nosebleeds.   Eyes: Negative.  Negative for blurred vision, double vision and photophobia.  Respiratory: Negative.  Negative for cough and shortness of breath.   Cardiovascular: Negative.  Negative for chest pain, palpitations and leg swelling.  Gastrointestinal: Negative.  Negative for heartburn, nausea and vomiting.  Musculoskeletal: Negative.  Negative for  myalgias.  Neurological: Positive for sensory change (left hand). Negative for dizziness, focal weakness, seizures and headaches.  Psychiatric/Behavioral: Negative.  Negative for suicidal ideas.    Past Medical History:  Diagnosis Date  . Gout   . Hypertension     Past Surgical History:  Procedure Laterality Date  . FOOT SURGERY    . QUADRICEPS TENDON REPAIR      Family History  Problem Relation Age of Onset  . Cancer Mother      Social History Reviewed with no changes to be made today.   Outpatient Medications Prior to Visit  Medication Sig Dispense Refill  . aspirin EC 81 MG tablet Take 1 tablet (81 mg total) by mouth daily. 30 tablet 1  . indomethacin (INDOCIN) 25 MG capsule Take 1 capsule (25 mg total) by mouth 2 (two) times daily with a meal. As needed for pain 20 capsule 0  . lisinopril (ZESTRIL) 20 MG tablet Take 1 tablet (20 mg total) by mouth daily. 90 tablet 0  . nitroGLYCERIN (NITROSTAT) 0.4 MG SL tablet Place 1 tablet (0.4 mg total) under the tongue every 5 (five) minutes as needed for chest pain. Call 911 if symptoms do not resolve after 3 doses 50 tablet 1  . atorvastatin (LIPITOR) 20 MG tablet Take 1 tablet (20 mg total) by mouth daily. 90 tablet 3  . cyclobenzaprine (FLEXERIL) 10 MG tablet Take 1 tablet (10 mg total) by mouth 2 (two) times daily as needed for muscle spasms. (Patient not taking: Reported on 09/11/2018) 40 tablet 1  . meclizine (ANTIVERT) 25 MG tablet Take 1 tablet (25 mg total) by mouth 3 (three) times daily as needed for dizziness. (Patient not taking: Reported on 09/11/2018) 30 tablet 0  . allopurinol (ZYLOPRIM) 100 MG tablet Take 2 tablets (200 mg total) by mouth daily for 30 days. 60 tablet 1   No facility-administered medications prior to visit.     No Known Allergies     Objective:    BP (!) 151/97 (BP Location: Left Arm, Patient Position: Sitting, Cuff Size: Large)   Pulse 72   Temp 98.4 F (36.9 C) (Oral)   Resp (!) 1   Wt 227 lb (103 kg)   SpO2 98%   BMI 28.76 kg/m  Wt Readings from Last 3 Encounters:  09/11/18 227 lb (103 kg)  06/08/18 241 lb 3.2 oz (109.4 kg)  03/26/18 234 lb (106.1 kg)    Physical Exam Vitals signs and nursing note reviewed.  Constitutional:      Appearance: He is well-developed.  HENT:     Head: Normocephalic and atraumatic.  Neck:     Musculoskeletal: Normal range of motion.  Cardiovascular:     Rate and Rhythm: Normal rate and  regular rhythm.     Heart sounds: Normal heart sounds. No murmur. No friction rub. No gallop.   Pulmonary:     Effort: Pulmonary effort is normal. No tachypnea or respiratory distress.     Breath sounds: Normal breath sounds. No decreased breath sounds, wheezing, rhonchi or rales.  Chest:     Chest wall: No tenderness.  Abdominal:     General: Bowel sounds are normal.     Palpations: Abdomen is soft.  Musculoskeletal: Normal range of motion.  Skin:    General: Skin is warm and dry.  Neurological:     Mental Status: He is alert and oriented to person, place, and time.     Coordination: Coordination normal.  Psychiatric:  Behavior: Behavior normal. Behavior is cooperative.        Thought Content: Thought content normal.        Judgment: Judgment normal.          Patient has been counseled extensively about nutrition and exercise as well as the importance of adherence with medications and regular follow-up. The patient was given clear instructions to go to ER or return to medical center if symptoms don't improve, worsen or new problems develop. The patient verbalized understanding.   Follow-up: No follow-ups on file.   [name], FNP-BC [facility] and [facility] [place], [place]   09/13/2018, 9:41 PM

## 2018-09-11 NOTE — Progress Notes (Signed)
F/u HTN- out of meds x 2 days last week but got refill on Monday and taking them daily since   Intermittent  tingling  In left hand x 2 weeks

## 2018-09-12 LAB — CMP14+EGFR
ALT: 35 IU/L (ref 0–44)
AST: 31 IU/L (ref 0–40)
Albumin/Globulin Ratio: 1.5 (ref 1.2–2.2)
Albumin: 4.3 g/dL (ref 3.8–4.9)
Alkaline Phosphatase: 69 IU/L (ref 39–117)
BUN/Creatinine Ratio: 11 (ref 9–20)
BUN: 14 mg/dL (ref 6–24)
Bilirubin Total: 0.3 mg/dL (ref 0.0–1.2)
CO2: 23 mmol/L (ref 20–29)
Calcium: 9.6 mg/dL (ref 8.7–10.2)
Chloride: 102 mmol/L (ref 96–106)
Creatinine, Ser: 1.22 mg/dL (ref 0.76–1.27)
GFR calc Af Amer: 78 mL/min/{1.73_m2} (ref >59)
GFR calc non Af Amer: 67 mL/min/{1.73_m2} (ref >59)
Globulin, Total: 2.8 g/dL (ref 1.5–4.5)
Glucose: 117 mg/dL — ABNORMAL HIGH (ref 65–99)
Potassium: 5.5 mmol/L — ABNORMAL HIGH (ref 3.5–5.2)
Sodium: 140 mmol/L (ref 134–144)
Total Protein: 7.1 g/dL (ref 6.0–8.5)

## 2018-09-12 LAB — URIC ACID: Uric Acid: 6.7 mg/dL (ref 3.7–8.6)

## 2018-09-13 ENCOUNTER — Encounter: Payer: Self-pay | Admitting: Nurse Practitioner

## 2018-09-13 DIAGNOSIS — I10 Essential (primary) hypertension: Secondary | ICD-10-CM | POA: Insufficient documentation

## 2018-09-13 DIAGNOSIS — M1A9XX0 Chronic gout, unspecified, without tophus (tophi): Secondary | ICD-10-CM | POA: Insufficient documentation

## 2018-09-17 NOTE — Progress Notes (Signed)
CMA spoke to patient to inform on results and PCP advising on medication changes.  Pt. Verified DOB. Pt. Understood.   Pt. Scheduled a BP check and labs on 06/29th with clinical pharmacist.

## 2018-09-28 ENCOUNTER — Ambulatory Visit: Payer: Self-pay | Attending: Nurse Practitioner | Admitting: Pharmacist

## 2018-09-28 ENCOUNTER — Encounter: Payer: Self-pay | Admitting: Pharmacist

## 2018-09-28 ENCOUNTER — Other Ambulatory Visit: Payer: Self-pay

## 2018-09-28 VITALS — BP 121/73 | HR 80

## 2018-09-28 DIAGNOSIS — I10 Essential (primary) hypertension: Secondary | ICD-10-CM

## 2018-09-28 NOTE — Patient Instructions (Signed)
Thank you for coming to see [place] today.   Blood pressure today is well-controlled .   Continue taking blood pressure medications for now. Someone will call you tomorrow with lab results and recommendations.   Limiting salt and caffeine, as well as exercising as able for at least 30 minutes for 5 days out of the week, can also help you lower your blood pressure.  Take your blood pressure at home if you are able. Please write down these numbers and bring them to your visits.  If you have any questions about medications, please call me [phone].  Lurena Joiner

## 2018-09-28 NOTE — Progress Notes (Signed)
   S: PCP: Zelda      Patient arrives in good spirits.    Presents to the clinic for hypertension evaluation, counseling, and management. Patient was referred by Zelda on 09/11/18. BP 151/97 at that visit but he reported being without medications. Of note, pt had lab work done that revealed hyperkalemia (5.5). Zelda instructed him to decrease lisinopril to half of the 20 mg tablet (10 mg total daily) and continue amlodipine 5 mg daily.  Patient reports adherence with medications.  Denies chest pain, dyspnea, Ha or blurred vision.  Current BP Medications include:  Amlodipine 5 mg daily, lisinopril 10 mg daily (1/2 of the 20 mg tablet)  Antihypertensives tried in the past include: Zestoretic (stopped possible d/t gout; pt cannot recall reason); amlodipine-benazepril 5-20 mg daily (unable to recall)  Dietary habits include: limits salt; endorses tea 3x/week Exercise habits include:walks 5x/week x45 minutes Family / Social history: no pertinent family hx; never smoker; occasionally drinks alcohol  Home BP readings: not taking  O:  L arm after 5 minutes rest: 121/73, HR 80  Last 3 Office BP readings: BP Readings from Last 3 Encounters:  09/28/18 121/73  09/11/18 (!) 151/97  06/08/18 (!) 167/104   BMET    Component Value Date/Time   NA 140 09/11/2018 1135   K 5.5 (H) 09/11/2018 1135   CL 102 09/11/2018 1135   CO2 23 09/11/2018 1135   GLUCOSE 117 (H) 09/11/2018 1135   BUN 14 09/11/2018 1135   CREATININE 1.22 09/11/2018 1135   CALCIUM 9.6 09/11/2018 1135   GFRNONAA 67 09/11/2018 1135   GFRAA 78 09/11/2018 1135   Renal function: CrCl cannot be calculated (Unknown ideal weight.).  Clinical ASCVD: No  The 10-year ASCVD risk score Mikey Bussing DC Jr., et al., 2013) is: 8.6%   Values used to calculate the score:     Age: 53 years     Sex: Male     Is Non-Hispanic African American: Yes     Diabetic: No     Tobacco smoker: No     Systolic Blood Pressure: 836 mmHg     Is BP treated:  Yes     HDL Cholesterol: 78 mg/dL     Total Cholesterol: 265 mg/dL   A/P: Hypertension longstanding currently controlled on current medications. BP Goal <130/80 mmHg. Patient is adherent with current medications.  -Continued current regimen.  -F/u labs ordered - BMP -Counseled on lifestyle modifications for blood pressure control including reduced dietary sodium, increased exercise, adequate sleep  Results reviewed and written information provided.  Total time in face-to-face counseling 15 minutes.   F/U Clinic Visit w/ PCP.    Addendum: patient's K level came back WNL at 4.4. Discussed with PCP. Will have patient to continue lisinopril but at the 10 mg dose. I will send this to his pharmacy as the 10 mg tablet so he does not have to break the 20 mg tablet. He is to continue amlodipine.   Patient seen with: Jonathon Bellows PharmD Candidate [phone] [place], PharmD, [place] [phone]

## 2018-09-29 LAB — BASIC METABOLIC PANEL
BUN/Creatinine Ratio: 13 (ref 9–20)
BUN: 15 mg/dL (ref 6–24)
CO2: 22 mmol/L (ref 20–29)
Calcium: 10.1 mg/dL (ref 8.7–10.2)
Chloride: 98 mmol/L (ref 96–106)
Creatinine, Ser: 1.15 mg/dL (ref 0.76–1.27)
GFR calc Af Amer: 84 mL/min/{1.73_m2} (ref >59)
GFR calc non Af Amer: 72 mL/min/{1.73_m2} (ref >59)
Glucose: 119 mg/dL — ABNORMAL HIGH (ref 65–99)
Potassium: 4.4 mmol/L (ref 3.5–5.2)
Sodium: 138 mmol/L (ref 134–144)

## 2018-09-29 MED ORDER — LISINOPRIL 10 MG PO TABS: 10.0000 mg | ORAL_TABLET | Freq: Every day | ORAL | 2 refills | Status: DC

## 2018-09-29 MED ORDER — AMLODIPINE BESYLATE 5 MG PO TABS: 5.0000 mg | ORAL_TABLET | Freq: Every day | ORAL | 2 refills | Status: DC

## 2018-09-29 NOTE — Telephone Encounter (Signed)
Opened in error

## 2018-09-30 MED FILL — LISINOPRIL 10 MG TABS: 10 | 30 days supply | Qty: 30 | Fill #0

## 2018-09-30 MED FILL — AMLODIPINE BESYLATE 5 MG TA: 5 | 30 days supply | Qty: 30 | Fill #0

## 2018-10-06 NOTE — Progress Notes (Signed)
CMA spoke to patient to inform on lab results.  Pt. Verified DOB Pt. Understood.  

## 2018-10-19 ENCOUNTER — Other Ambulatory Visit: Payer: Self-pay | Admitting: Family Medicine

## 2018-10-19 DIAGNOSIS — I10 Essential (primary) hypertension: Secondary | ICD-10-CM

## 2018-10-27 MED FILL — LISINOPRIL 10 MG TABS: 10 | 30 days supply | Qty: 30 | Fill #0

## 2018-10-27 MED FILL — AMLODIPINE BESYLATE 5 MG TA: 5 | 30 days supply | Qty: 30 | Fill #0

## 2020-07-05 ENCOUNTER — Other Ambulatory Visit: Payer: Self-pay

## 2020-07-05 ENCOUNTER — Ambulatory Visit (HOSPITAL_COMMUNITY)
Admission: RE | Admit: 2020-07-05 | Discharge: 2020-07-05 | Disposition: A | Discharge status: Home / Self Care | Payer: Self-pay | Source: Ambulatory Visit | Attending: Physician Assistant | Admitting: Physician Assistant

## 2020-07-05 ENCOUNTER — Ambulatory Visit: Payer: Self-pay | Admitting: Physician Assistant

## 2020-07-05 VITALS — BP 161/100 | HR 72 | Temp 98.2°F | Resp 18 | Ht 73.5 in | Wt 232.0 lb

## 2020-07-05 DIAGNOSIS — M25562 Pain in left knee: Secondary | ICD-10-CM | POA: Insufficient documentation

## 2020-07-05 DIAGNOSIS — R739 Hyperglycemia, unspecified: Secondary | ICD-10-CM

## 2020-07-05 DIAGNOSIS — Z114 Encounter for screening for human immunodeficiency virus [HIV]: Secondary | ICD-10-CM

## 2020-07-05 DIAGNOSIS — R7309 Other abnormal glucose: Secondary | ICD-10-CM

## 2020-07-05 DIAGNOSIS — I10 Essential (primary) hypertension: Secondary | ICD-10-CM

## 2020-07-05 DIAGNOSIS — M1A09X0 Idiopathic chronic gout, multiple sites, without tophus (tophi): Secondary | ICD-10-CM

## 2020-07-05 DIAGNOSIS — E782 Mixed hyperlipidemia: Secondary | ICD-10-CM

## 2020-07-05 DIAGNOSIS — Z125 Encounter for screening for malignant neoplasm of prostate: Secondary | ICD-10-CM

## 2020-07-05 LAB — POCT GLYCOSYLATED HEMOGLOBIN (HGB A1C): Hemoglobin A1C: 5.6 % (ref 4.0–5.6)

## 2020-07-05 MED ORDER — INDOMETHACIN 50 MG PO CAPS
50.0000 mg | ORAL_CAPSULE | Freq: Three times a day (TID) | ORAL | 0 refills | Status: DC
  Filled 2020-07-05: qty 30, 10d supply, fill #0

## 2020-07-05 MED ORDER — ATORVASTATIN CALCIUM 20 MG PO TABS
20.0000 mg | ORAL_TABLET | Freq: Every day | ORAL | 0 refills | Status: DC
  Filled 2020-07-05: qty 90, 90d supply, fill #0

## 2020-07-05 MED ORDER — KETOROLAC TROMETHAMINE 30 MG/ML IJ SOLN
60.0000 mg | Freq: Once | INTRAMUSCULAR | Status: AC
  Administered 2020-07-05: 60 mg via INTRAMUSCULAR

## 2020-07-05 MED ORDER — AMLODIPINE BESYLATE 5 MG PO TABS
5.0000 mg | ORAL_TABLET | Freq: Every day | ORAL | 0 refills | Status: DC
  Filled 2020-07-05: qty 30, 30d supply, fill #0

## 2020-07-05 NOTE — Progress Notes (Signed)
Established Patient Office Visit  Subjective:  Patient ID: Gary Villanueva, male    DOB: 1966-03-07  Age: 55 y.o. MRN: [id number]  CC: No chief complaint on file.   HPI Gary Villanueva reports that he started having left knee pain Monday morning.  Denies injury or trauma, however does endorse that he had surgery on that knee approximately 4 years ago after being hurt at work and having injury to his patella tendon.  Does endorse history of chronic gout, states he has never had gout in his left knee, does not usually been present in his right knee, but does state that it feels similar.  States that he drank 3 beers on Saturday, and on Sunday did go to the [employer] and states that he did have some shrimp and fried fish.  Reports that he drinks at least 5-6 bottles of water a day.  Reports that he has been using ibuprofen without much relief.  Reports that he has not followed up with his primary care provider since June 2020, states that he has since then ran out of his medications.  Does not check blood pressure at home.    08/16/20    Past Medical History:  Diagnosis Date  . Gout   . Hypertension     Past Surgical History:  Procedure Laterality Date  . FOOT SURGERY    . QUADRICEPS TENDON REPAIR      Family History  Problem Relation Age of Onset  . Cancer Mother     Social History   Socioeconomic History  . Marital status: Single    Spouse name: Not on file  . Number of children: Not on file  . Years of education: Not on file  . Highest education level: Not on file  Occupational History  . Not on file  Tobacco Use  . Smoking status: Never Smoker  . Smokeless tobacco: Never Used  Substance and Sexual Activity  . Alcohol use: Yes    Alcohol/week: 2.0 standard drinks    Types: 2 Cans of beer per week    Comment: occ  . Drug use: No  . Sexual activity: Yes    Birth control/protection: None  Other Topics Concern  . Not on file  Social History  Narrative  . Not on file   Social Determinants of Health   Financial Resource Strain: Not on file  Food Insecurity: Not on file  Transportation Needs: Not on file  Physical Activity: Not on file  Stress: Not on file  Social Connections: Not on file  Intimate Partner Violence: Not on file    Outpatient Medications Prior to Visit  Medication Sig Dispense Refill  . allopurinol (ZYLOPRIM) 100 MG tablet Take 2 tablets (200 mg total) by mouth daily for 30 days. 60 tablet 1  . amLODipine (NORVASC) 5 MG tablet Take 1 tablet (5 mg total) by mouth daily. 30 tablet 2  . aspirin EC 81 MG tablet Take 1 tablet (81 mg total) by mouth daily. 30 tablet 1  . atorvastatin (LIPITOR) 20 MG tablet Take 1 tablet (20 mg total) by mouth daily. 90 tablet 3  . cyclobenzaprine (FLEXERIL) 10 MG tablet Take 1 tablet (10 mg total) by mouth 2 (two) times daily as needed for muscle spasms. (Patient not taking: Reported on 09/11/2018) 40 tablet 1  . indomethacin (INDOCIN) 25 MG capsule Take 1 capsule (25 mg total) by mouth 2 (two) times daily with a meal. As needed for pain 20 capsule  0  . lisinopril (ZESTRIL) 10 MG tablet Take 1 tablet (10 mg total) by mouth daily. 30 tablet 2  . meclizine (ANTIVERT) 25 MG tablet Take 1 tablet (25 mg total) by mouth 3 (three) times daily as needed for dizziness. (Patient not taking: Reported on 09/11/2018) 30 tablet 0  . nitroGLYCERIN (NITROSTAT) 0.4 MG SL tablet Place 1 tablet (0.4 mg total) under the tongue every 5 (five) minutes as needed for chest pain. Call 911 if symptoms do not resolve after 3 doses 50 tablet 1   No facility-administered medications prior to visit.    No Known Allergies  ROS Review of Systems  Constitutional: Negative.   HENT: Negative.   Eyes: Negative.   Respiratory: Negative for shortness of breath.   Cardiovascular: Negative for chest pain.  Gastrointestinal: Negative.   Endocrine: Negative.   Genitourinary: Negative.   Musculoskeletal: Positive for  gait problem and joint swelling.  Skin: Negative for color change and wound.  Allergic/Immunologic: Negative.   Neurological: Negative for weakness and headaches.  Hematological: Negative.   Psychiatric/Behavioral: Negative.       Objective:    Physical Exam Vitals and nursing note reviewed.  Constitutional:      General: He is not in acute distress.    Appearance: Normal appearance. He is not ill-appearing.  HENT:     Head: Normocephalic and atraumatic.     Right Ear: External ear normal.     Left Ear: External ear normal.     Nose: Nose normal.     Mouth/Throat:     Mouth: Mucous membranes are moist.     Pharynx: Oropharynx is clear.  Eyes:     Extraocular Movements: Extraocular movements intact.     Conjunctiva/sclera: Conjunctivae normal.     Pupils: Pupils are equal, round, and reactive to light.  Cardiovascular:     Rate and Rhythm: Normal rate and regular rhythm.     Pulses: Normal pulses.     Heart sounds: Normal heart sounds.  Pulmonary:     Effort: Pulmonary effort is normal.     Breath sounds: Normal breath sounds.  Musculoskeletal:     Cervical back: Normal range of motion and neck supple.     Right knee: Normal.     Left knee: Swelling present. No erythema. Decreased range of motion. Tenderness present.  Skin:    General: Skin is warm and dry.  Neurological:     General: No focal deficit present.     Mental Status: He is alert and oriented to person, place, and time.  Psychiatric:        Mood and Affect: Mood normal.        Behavior: Behavior normal.        Thought Content: Thought content normal.        Judgment: Judgment normal.     There were no vitals taken for this visit. Wt Readings from Last 3 Encounters:  09/11/18 227 lb (103 kg)  06/08/18 241 lb 3.2 oz (109.4 kg)  03/26/18 234 lb (106.1 kg)     Health Maintenance Due  Topic Date Due  . Hepatitis C Screening  Never done  . HIV Screening  Never done  . TETANUS/TDAP  Never done  .  COLONOSCOPY (Pts 45-1yrs Insurance coverage will need to be confirmed)  Never done    There are no preventive care reminders to display for this patient.  No results found for: TSH Lab Results  Component Value Date   WBC  4.3 03/04/2018   HGB 13.4 03/04/2018   HCT 39.2 03/04/2018   MCV 83 03/04/2018   PLT 160 03/04/2018   Lab Results  Component Value Date   NA 138 09/28/2018   K 4.4 09/28/2018   CO2 22 09/28/2018   GLUCOSE 119 (H) 09/28/2018   BUN 15 09/28/2018   CREATININE 1.15 09/28/2018   BILITOT 0.3 09/11/2018   ALKPHOS 69 09/11/2018   AST 31 09/11/2018   ALT 35 09/11/2018   PROT 7.1 09/11/2018   ALBUMIN 4.3 09/11/2018   CALCIUM 10.1 09/28/2018   Lab Results  Component Value Date   CHOL 265 (H) 06/08/2018   Lab Results  Component Value Date   HDL 78 06/08/2018   Lab Results  Component Value Date   LDLCALC 142 (H) 06/08/2018   Lab Results  Component Value Date   TRIG 225 (H) 06/08/2018   Lab Results  Component Value Date   CHOLHDL 3.4 06/08/2018   No results found for: HGBA1C    Assessment & Plan:   Problem List Items Addressed This Visit   None   No orders of the defined types were placed in this encounter. 1. Chronic gout of multiple sites, unspecified cause Trial indomethacin 50 mg 3 times a day until pain resolves.  Patient to return to mobile unit for fasting labs.  Patient has appointment upcoming with primary care provider Aug 16, 2020.  Patient encouraged to return to mobile unit prior to that if knee pain does not resolve - indomethacin (INDOCIN) 50 MG capsule; Take 1 capsule (50 mg total) by mouth 3 (three) times daily with meals.  Dispense: 30 capsule; Refill: 0 - ketorolac (TORADOL) 30 MG/ML injection 60 mg  2. Essential hypertension At last office visit for patient in June 2020, his blood pressure medication was reduced, amlodipine 5 mg, lisinopril 5 mg due to abnormalities in labs.  Patient encouraged to resume amlodipine 5 mg, check  blood pressure at home on a daily basis, keep a written log and have available for all office visits.  Patient encouraged to return to mobile unit for blood pressure check in 2 to 3 weeks. - amLODipine (NORVASC) 5 MG tablet; Take 1 tablet (5 mg total) by mouth daily.  Dispense: 30 tablet; Refill: 0 - CBC with Differential/Platelet; Future - Comp. Metabolic Panel (12); Future - TSH; Future  3. Mixed hyperlipidemia Patient encouraged to resume Lipitor - atorvastatin (LIPITOR) 20 MG tablet; Take 1 tablet (20 mg total) by mouth daily.  Dispense: 90 tablet; Refill: 0 - Lipid panel; Future  4. Elevated random blood glucose level A1c 5.6.  Patient education given on reducing daily added sugars - HgB A1c  5. Acute pain of left knee Gout versus trauma due to previous knee surgery. - indomethacin (INDOCIN) 50 MG capsule; Take 1 capsule (50 mg total) by mouth 3 (three) times daily with meals.  Dispense: 30 capsule; Refill: 0 - DG Knee 1-2 Views Left; Future  6. Screening for HIV without presence of risk factors  - HIV antibody (with reflex); Future  7. Screening PSA (prostate specific antigen)  - PSA; Future  Patient previously had orange card, financial assistant has lapsed, patient given packet of information to reapply for financial assistance.  I have reviewed the patient's medical history (PMH, PSH, Social History, Family History, Medications, and allergies) , and have been updated if relevant. I spent 30 minutes reviewing chart and  face to face time with patient.     Follow-up: No follow-ups  on file.    Loraine Grip May[place], PA-C

## 2020-07-05 NOTE — Progress Notes (Signed)
Patient has had a banana. Patient has not taken medication today. Patient reports swelling with throbbing beginning Monday in the left knee. Patient has had similar concern in right knee. Patient has surgery on the left knee 4 years.

## 2020-07-05 NOTE — Patient Instructions (Signed)
For your knee pain, I encourage you to have the x-ray completed, I sent indomethacin to your pharmacy, you will take 50 mg every 8 hours until pain resolves.  Please return to the mobile unit tomorrow or the beginning of next week for fasting labs.  Please restart the amlodipine for your blood pressure.  Please check your blood pressure at home on a daily basis, keep a written log and have available for all office visits.  I encourage you to return to the mobile unit in a few weeks to recheck your blood pressure.  Feel free to bring your blood pressure machine with you if you are concerned it is not working well.  Please restart your cholesterol medication at this time.  We will call you with your lab and x-ray results once they are available.  Roney Jaffe, PA-C Physician Assistant [facility] [email or web address]    Acute Knee Pain, Adult Acute knee pain is sudden and may be caused by damage, swelling, or irritation of the muscles and tissues that support the knee. Pain may result from:  A fall.  An injury to the knee from twisting motions.  A hit to the knee.  Infection. Acute knee pain may go away on its own with time and rest. If it does not, your health care provider may order tests to find the cause of the pain. These may include:  Imaging tests, such as an X-ray, MRI, CT scan, or ultrasound.  Joint aspiration. In this test, fluid is removed from the knee and evaluated.  Arthroscopy. In this test, a lighted tube is inserted into the knee and an image is projected onto a TV screen.  Biopsy. In this test, a sample of tissue is removed from the body and studied under a microscope. Follow these instructions at home: If you have a knee sleeve or brace:  Wear the knee sleeve or brace as told by your health care provider. Remove it only as told by your health care provider.  Loosen it if your toes tingle, become numb, or  turn cold and blue.  Keep it clean.  If the knee sleeve or brace is not waterproof: ? Do not let it get wet. ? Cover it with a watertight covering when you take a bath or shower.   Activity  Rest your knee.  Do not do things that cause pain or make pain worse.  Avoid high-impact activities or exercises, such as running, jumping rope, or doing jumping jacks.  Work with a physical therapist to make a safe exercise program, as recommended by your health care provider. Do exercises as told by your physical therapist. Managing pain, stiffness, and swelling  If directed, put ice on the affected knee. To do this: ? If you have a removable knee sleeve or brace, remove it as told by your health care provider. ? Put ice in a plastic bag. ? Place a towel between your skin and the bag. ? Leave the ice on for 20 minutes, 2-3 times a day. ? Remove the ice if your skin turns bright red. This is very important. If you cannot feel pain, heat, or cold, you have a greater risk of damage to the area.  If directed, use an elastic bandage to put pressure (compression) on your injured knee. This may control swelling, give support, and help with discomfort.  Raise (elevate) your knee above the level of your heart while you are sitting or lying down.  Sleep with a  pillow under your knee.   General instructions  Take over-the-counter and prescription medicines only as told by your health care provider.  Do not use any products that contain nicotine or tobacco, such as cigarettes, e-cigarettes, and chewing tobacco. If you need help quitting, ask your health care provider.  If you are overweight, work with your health care provider and a dietitian to set a weight-loss goal that is healthy and reasonable for you. Extra weight can put pressure on your knee.  Pay attention to any changes in your symptoms.  Keep all follow-up visits. This is important. Contact a health care provider if:  Your knee pain  continues, changes, or gets worse.  You have a fever along with knee pain.  Your knee feels warm to the touch or is red.  Your knee buckles or locks up. Get help right away if:  Your knee swells, and the swelling becomes worse.  You cannot move your knee.  You have severe pain in your knee that cannot be managed with pain medicine. Summary  Acute knee pain can be caused by a fall, an injury, an infection, or damage, swelling, or irritation of the tissues that support your knee.  Your health care provider may perform tests to find out the cause of the pain.  Pay attention to any changes in your symptoms. Relieve your pain with rest, medicines, light activity, and the use of ice.  Get help right away if your knee swells, you cannot move your knee, or you have severe pain that cannot be managed with medicine. This information is not intended to replace advice given to you by your health care provider. Make sure you discuss any questions you have with your health care provider. Document Revised: 09/01/2019 Document Reviewed: 09/01/2019 Elsevier Patient Education  2021 [employer] Inc.   How to Take Your Blood Pressure Blood pressure is a measurement of how strongly your blood is pressing against the walls of your arteries. Arteries are blood vessels that carry blood from your heart throughout your body. Your health care provider takes your blood pressure at each office visit. You can also take your own blood pressure at home with a blood pressure monitor. You may need to take your own blood pressure to:  Confirm a diagnosis of high blood pressure (hypertension).  Monitor your blood pressure over time.  Make sure your blood pressure medicine is working. Supplies needed:  Blood pressure monitor.  Dining room chair to sit in.  Table or desk.  Small notebook and pencil or pen. How to prepare To get the most accurate reading, avoid the following for 30 minutes before you check  your blood pressure:  Drinking caffeine.  Drinking alcohol.  Eating.  Smoking.  Exercising. Five minutes before you check your blood pressure:  Use the bathroom and urinate so that you have an empty bladder.  Sit quietly in a dining room chair. Do not sit in a soft couch or an armchair. Do not talk. How to take your blood pressure To check your blood pressure, follow the instructions in the manual that came with your blood pressure monitor. If you have a digital blood pressure monitor, the instructions may be as follows: 1. Sit up straight in a chair. 2. Place your feet on the floor. Do not cross your ankles or legs. 3. Rest your left arm at the level of your heart on a table or desk or on the arm of a chair. 4. Pull up your shirt sleeve.  5. Wrap the blood pressure cuff around the upper part of your left arm, 1 inch (2.5 cm) above your elbow. It is best to wrap the cuff around bare skin. 6. Fit the cuff snugly around your arm. You should be able to place only one finger between the cuff and your arm. 7. Position the cord so that it rests in the bend of your elbow. 8. Press the power button. 9. Sit quietly while the cuff inflates and deflates. 10. Read the digital reading on the monitor screen and write the numbers down (record them) in a notebook. 11. Wait 2-3 minutes, then repeat the steps, starting at step 1.   What does my blood pressure reading mean? A blood pressure reading consists of a higher number over a lower number. Ideally, your blood pressure should be below 120/80. The first ("top") number is called the systolic pressure. It is a measure of the pressure in your arteries as your heart beats. The second ("bottom") number is called the diastolic pressure. It is a measure of the pressure in your arteries as the heart relaxes. Blood pressure is classified into five stages. The following are the stages for adults who do not have a short-term serious illness or a chronic  condition. Systolic pressure and diastolic pressure are measured in a unit called mm Hg (millimeters of mercury).  Normal  Systolic pressure: below 120.  Diastolic pressure: below 80. Elevated  Systolic pressure: 120-129.  Diastolic pressure: below 80. Hypertension stage 1  Systolic pressure: 130-139.  Diastolic pressure: 80-89. Hypertension stage 2  Systolic pressure: 140 or above.  Diastolic pressure: 90 or above. You can have elevated blood pressure or hypertension even if only the systolic or only the diastolic number in your reading is higher than normal. Follow these instructions at home:  Check your blood pressure as often as recommended by your health care provider.  Check your blood pressure at the same time every day.  Take your monitor to the next appointment with your health care provider to make sure that: ? You are using it correctly. ? It provides accurate readings.  Be sure you understand what your goal blood pressure numbers are.  Tell your health care provider if you are having any side effects from blood pressure medicine.  Keep all follow-up visits as told by your health care provider. This is important. General tips  Your health care provider can suggest a reliable monitor that will meet your needs. There are several types of home blood pressure monitors.  Choose a monitor that has an arm cuff. Do not choose a monitor that measures your blood pressure from your wrist or finger.  Choose a cuff that wraps snugly around your upper arm. You should be able to fit only one finger between your arm and the cuff.  You can buy a blood pressure monitor at most drugstores or online. Where to find more information American Heart Association: www.heart.org Contact a health care provider if:  Your blood pressure is consistently high. Get help right away if:  Your systolic blood pressure is higher than 180.  Your diastolic blood pressure is higher than  120. Summary  Blood pressure is a measurement of how strongly your blood is pressing against the walls of your arteries.  A blood pressure reading consists of a higher number over a lower number. Ideally, your blood pressure should be below 120/80.  Check your blood pressure at the same time every day.  Avoid caffeine, alcohol, smoking,  and exercise for 30 minutes prior to checking your blood pressure. These agents can affect the accuracy of the blood pressure reading. This information is not intended to replace advice given to you by your health care provider. Make sure you discuss any questions you have with your health care provider. Document Revised: 03/12/2019 Document Reviewed: 03/12/2019 Elsevier Patient Education  2021 [employer].

## 2020-07-06 ENCOUNTER — Ambulatory Visit: Payer: Self-pay | Admitting: *Deleted

## 2020-07-06 DIAGNOSIS — E782 Mixed hyperlipidemia: Secondary | ICD-10-CM

## 2020-07-06 DIAGNOSIS — Z125 Encounter for screening for malignant neoplasm of prostate: Secondary | ICD-10-CM

## 2020-07-06 DIAGNOSIS — Z114 Encounter for screening for human immunodeficiency virus [HIV]: Secondary | ICD-10-CM

## 2020-07-06 DIAGNOSIS — I10 Essential (primary) hypertension: Secondary | ICD-10-CM

## 2020-07-06 NOTE — Progress Notes (Signed)
Patient complains of left knee pain beginning Sunday with pain increasing throughout the day. Patient has eaten today and patient has taken medication. Patient will return next Tuesday for fasting labs

## 2020-07-11 ENCOUNTER — Ambulatory Visit: Payer: Self-pay | Admitting: *Deleted

## 2020-07-11 ENCOUNTER — Other Ambulatory Visit: Payer: Self-pay

## 2020-07-11 DIAGNOSIS — I10 Essential (primary) hypertension: Secondary | ICD-10-CM

## 2020-07-11 NOTE — Progress Notes (Signed)
Patient tolerated blood work well today

## 2020-07-12 LAB — CBC WITH DIFFERENTIAL/PLATELET
Basophils Absolute: 0 10*3/uL (ref 0.0–0.2)
Basos: 1 %
EOS (ABSOLUTE): 0 10*3/uL (ref 0.0–0.4)
Eos: 0 %
Hematocrit: 44.4 % (ref 37.5–51.0)
Hemoglobin: 14.9 g/dL (ref 13.0–17.7)
Immature Grans (Abs): 0 10*3/uL (ref 0.0–0.1)
Immature Granulocytes: 0 %
Lymphocytes Absolute: 1.4 10*3/uL (ref 0.7–3.1)
Lymphs: 29 %
MCH: 28.8 pg (ref 26.6–33.0)
MCHC: 33.6 g/dL (ref 31.5–35.7)
MCV: 86 fL (ref 79–97)
Monocytes Absolute: 0.4 10*3/uL (ref 0.1–0.9)
Monocytes: 9 %
Neutrophils Absolute: 3 10*3/uL (ref 1.4–7.0)
Neutrophils: 61 %
Platelets: 190 10*3/uL (ref 150–450)
RBC: 5.18 x10E6/uL (ref 4.14–5.80)
RDW: 13.4 % (ref 11.6–15.4)
WBC: 5 10*3/uL (ref 3.4–10.8)

## 2020-07-12 LAB — COMP. METABOLIC PANEL (12)
AST: 29 IU/L (ref 0–40)
Albumin/Globulin Ratio: 1.4 (ref 1.2–2.2)
Albumin: 4.3 g/dL (ref 3.8–4.9)
Alkaline Phosphatase: 68 IU/L (ref 44–121)
BUN/Creatinine Ratio: 13 (ref 9–20)
BUN: 18 mg/dL (ref 6–24)
Bilirubin Total: 0.4 mg/dL (ref 0.0–1.2)
Calcium: 9.6 mg/dL (ref 8.7–10.2)
Chloride: 102 mmol/L (ref 96–106)
Creatinine, Ser: 1.4 mg/dL — ABNORMAL HIGH (ref 0.76–1.27)
Globulin, Total: 3 g/dL (ref 1.5–4.5)
Glucose: 103 mg/dL — ABNORMAL HIGH (ref 65–99)
Potassium: 4.9 mmol/L (ref 3.5–5.2)
Sodium: 138 mmol/L (ref 134–144)
Total Protein: 7.3 g/dL (ref 6.0–8.5)
eGFR: 59 mL/min/{1.73_m2} — ABNORMAL LOW (ref >59)

## 2020-07-12 LAB — PSA: Prostate Specific Ag, Serum: 3.8 ng/mL (ref 0.0–4.0)

## 2020-07-12 LAB — HIV ANTIBODY (ROUTINE TESTING W REFLEX): HIV Screen 4th Generation wRfx: NONREACTIVE

## 2020-07-12 LAB — TSH: TSH: 1.43 u[IU]/mL (ref 0.450–4.500)

## 2020-07-16 LAB — LIPID PANEL
Chol/HDL Ratio: 3.8 ratio (ref 0.0–5.0)
Cholesterol, Total: 201 mg/dL — ABNORMAL HIGH (ref 100–199)
HDL: 53 mg/dL (ref >39)
LDL Chol Calc (NIH): 131 mg/dL — ABNORMAL HIGH (ref 0–99)
Triglycerides: 92 mg/dL (ref 0–149)
VLDL Cholesterol Cal: 17 mg/dL (ref 5–40)

## 2020-07-18 ENCOUNTER — Telehealth: Payer: Self-pay | Admitting: *Deleted

## 2020-07-18 NOTE — Telephone Encounter (Signed)
-----   Message from Roney Jaffe, [place] sent at 07/17/2020 11:52 AM EDT ----- Please call patient and let him know that his thyroid function, kidney and liver function are within normal limits.  He does show signs of dehydration.  His screening for HIV and prostate cancer were negative.  He does not show any signs of anemia.  His cholesterol is elevated and he does have an elevated risk of a cardiovascular event in the next 10 years of 18%.  It is very important that he takes his cholesterol medication on a daily basis and follow a low-cholesterol diet.  He should have his cholesterol levels rechecked in 6 months to 1 year.  The 10-year ASCVD risk score Denman George DC Montez Hageman., et al., 2013) is: 18%   Values used to calculate the score:     Age: 55 years     Sex: Male     Is Non-Hispanic African American: Yes     Diabetic: No     Tobacco smoker: No     Systolic Blood Pressure: 171 mmHg     Is BP treated: Yes     HDL Cholesterol: 53 mg/dL     Total Cholesterol: 201 mg/dL

## 2020-07-18 NOTE — Telephone Encounter (Signed)
Patient verified DOB Patient is aware of labs being normal except for elevated cholesterol and dehydration. Patient advised to adhere to increasing water intake to match half of his weight. Patient aware of limiting carbs and sodium along with taking medication. Patient will FU with PCP for recheck in 6 months.

## 2020-08-16 ENCOUNTER — Ambulatory Visit: Payer: Self-pay | Attending: Nurse Practitioner | Admitting: Nurse Practitioner

## 2020-08-16 ENCOUNTER — Other Ambulatory Visit: Payer: Self-pay | Admitting: Nurse Practitioner

## 2020-08-16 ENCOUNTER — Other Ambulatory Visit: Payer: Self-pay

## 2020-08-16 ENCOUNTER — Encounter: Payer: Self-pay | Admitting: Nurse Practitioner

## 2020-08-16 VITALS — BP 162/109 | HR 70 | Resp 16 | Ht 73.5 in | Wt 239.8 lb

## 2020-08-16 DIAGNOSIS — I10 Essential (primary) hypertension: Secondary | ICD-10-CM

## 2020-08-16 DIAGNOSIS — E782 Mixed hyperlipidemia: Secondary | ICD-10-CM

## 2020-08-16 DIAGNOSIS — R7989 Other specified abnormal findings of blood chemistry: Secondary | ICD-10-CM

## 2020-08-16 DIAGNOSIS — Z7689 Persons encountering health services in other specified circumstances: Secondary | ICD-10-CM

## 2020-08-16 DIAGNOSIS — N529 Male erectile dysfunction, unspecified: Secondary | ICD-10-CM

## 2020-08-16 DIAGNOSIS — Z1211 Encounter for screening for malignant neoplasm of colon: Secondary | ICD-10-CM

## 2020-08-16 MED ORDER — AMLODIPINE BESYLATE 10 MG PO TABS
10.0000 mg | ORAL_TABLET | Freq: Every day | ORAL | 1 refills | Status: DC
  Filled 2020-08-16: qty 30, 30d supply, fill #0
  Filled 2020-09-13: qty 30, 30d supply, fill #1
  Filled 2020-10-20 – 2020-11-08 (×2): qty 30, 30d supply, fill #2
  Filled 2020-12-07: qty 30, 30d supply, fill #3

## 2020-08-16 MED ORDER — LISINOPRIL 10 MG PO TABS
10.0000 mg | ORAL_TABLET | Freq: Every day | ORAL | 1 refills | Status: DC
  Filled 2020-08-16: qty 30, 30d supply, fill #0
  Filled 2020-09-13: qty 30, 30d supply, fill #1
  Filled 2020-10-20 – 2020-11-08 (×2): qty 30, 30d supply, fill #2

## 2020-08-16 MED ORDER — SILDENAFIL CITRATE 100 MG PO TABS
50.0000 mg | ORAL_TABLET | Freq: Every day | ORAL | 11 refills | Status: DC | PRN
  Filled 2020-08-16: qty 5, 5d supply, fill #0
  Filled 2020-09-13: qty 5, 5d supply, fill #1

## 2020-08-16 MED ORDER — ATORVASTATIN CALCIUM 20 MG PO TABS
20.0000 mg | ORAL_TABLET | Freq: Every day | ORAL | 3 refills | Status: DC
  Filled 2020-08-16: qty 90, 90d supply, fill #0
  Filled 2020-12-07: qty 30, 30d supply, fill #1

## 2020-08-16 NOTE — Patient Instructions (Signed)
Gout  Gout is a condition that causes painful swelling of the joints. Gout is a type of inflammation of the joints (arthritis). This condition is caused by having too much uric acid in the body. Uric acid is a chemical that forms when the body breaks down substances called purines. Purines are important for building body proteins. When the body has too much uric acid, sharp crystals can form and build up inside the joints. This causes pain and swelling. Gout attacks can happen quickly and may be very painful (acute gout). Over time, the attacks can affect more joints and become more frequent (chronic gout). Gout can also cause uric acid to build up under the skin and inside the kidneys. What are the causes? This condition is caused by too much uric acid in your blood. This can happen because:  Your kidneys do not remove enough uric acid from your blood. This is the most common cause.  Your body makes too much uric acid. This can happen with some cancers and cancer treatments. It can also occur if your body is breaking down too many red blood cells (hemolytic anemia).  You eat too many foods that are high in purines. These foods include organ meats and some seafood. Alcohol, especially beer, is also high in purines. A gout attack may be triggered by trauma or stress. What increases the risk? You are more likely to develop this condition if you:  Have a family history of gout.  Are male and middle-aged.  Are male and have gone through menopause.  Are obese.  Frequently drink alcohol, especially beer.  Are dehydrated.  Lose weight too quickly.  Have an organ transplant.  Have lead poisoning.  Take certain medicines, including aspirin, cyclosporine, diuretics, levodopa, and niacin.  Have kidney disease.  Have a skin condition called psoriasis. What are the signs or symptoms? An attack of acute gout happens quickly. It usually occurs in just one joint. The most common place is  the big toe. Attacks often start at night. Other joints that may be affected include joints of the feet, ankle, knee, fingers, wrist, or elbow. Symptoms of this condition may include:  Severe pain.  Warmth.  Swelling.  Stiffness.  Tenderness. The affected joint may be very painful to touch.  Shiny, red, or purple skin.  Chills and fever. Chronic gout may cause symptoms more frequently. More joints may be involved. You may also have white or yellow lumps (tophi) on your hands or feet or in other areas near your joints.   How is this diagnosed? This condition is diagnosed based on your symptoms, medical history, and physical exam. You may have tests, such as:  Blood tests to measure uric acid levels.  Removal of joint fluid with a thin needle (aspiration) to look for uric acid crystals.  X-rays to look for joint damage. How is this treated? Treatment for this condition has two phases: treating an acute attack and preventing future attacks. Acute gout treatment may include medicines to reduce pain and swelling, including:  NSAIDs.  Steroids. These are strong anti-inflammatory medicines that can be taken by mouth (orally) or injected into a joint.  Colchicine. This medicine relieves pain and swelling when it is taken soon after an attack. It can be given by mouth or through an IV. Preventive treatment may include:  Daily use of smaller doses of NSAIDs or colchicine.  Use of a medicine that reduces uric acid levels in your blood.  Changes to your diet.   You may need to see a dietitian about what to eat and drink to prevent gout. Follow these instructions at home: During a gout attack  If directed, put ice on the affected area: ? Put ice in a plastic bag. ? Place a towel between your skin and the bag. ? Leave the ice on for 20 minutes, 2-3 times a day.  Raise (elevate) the affected joint above the level of your heart as often as possible.  Rest the joint as much as possible.  If the affected joint is in your leg, you may be given crutches to use.  Follow instructions from your health care provider about eating or drinking restrictions.   Avoiding future gout attacks  Follow a low-purine diet as told by your dietitian or health care provider. Avoid foods and drinks that are high in purines, including liver, kidney, anchovies, asparagus, herring, mushrooms, mussels, and beer.  Maintain a healthy weight or lose weight if you are overweight. If you want to lose weight, talk with your health care provider. It is important that you do not lose weight too quickly.  Start or maintain an exercise program as told by your health care provider. Eating and drinking  Drink enough fluids to keep your urine pale yellow.  If you drink alcohol: ? Limit how much you use to:  0-1 drink a day for women.  0-2 drinks a day for men. ? Be aware of how much alcohol is in your drink. In the [place] one drink equals one 12 oz bottle of beer (355 mL) one 5 oz glass of wine (148 mL), or one 1 oz glass of hard liquor (44 mL). General instructions  Take over-the-counter and prescription medicines only as told by your health care provider.  Do not drive or use heavy machinery while taking prescription pain medicine.  Return to your normal activities as told by your health care provider. Ask your health care provider what activities are safe for you.  Keep all follow-up visits as told by your health care provider. This is important. Contact a health care provider if you have:  Another gout attack.  Continuing symptoms of a gout attack after 10 days of treatment.  Side effects from your medicines.  Chills or a fever.  Burning pain when you urinate.  Pain in your lower back or belly. Get help right away if you:  Have severe or uncontrolled pain.  Cannot urinate. Summary  Gout is painful swelling of the joints caused by inflammation.  The most common site of pain is the big  toe, but it can affect other joints in the body.  Medicines and dietary changes can help to prevent and treat gout attacks. This information is not intended to replace advice given to you by your health care provider. Make sure you discuss any questions you have with your health care provider. Document Revised: 10/08/2017 Document Reviewed: 10/08/2017 Elsevier Patient Education  2021 [employer]vier Inc.  

## 2020-08-16 NOTE — Progress Notes (Signed)
Assessment & Plan:  Gary Villanueva was seen today for hypertension.  Diagnoses and all orders for this visit:  Encounter to establish care  Essential hypertension -     amLODipine (NORVASC) 10 MG tablet; Take 1 tablet (10 mg total) by mouth daily. NEEDS PASS -     lisinopril (ZESTRIL) 10 MG tablet; Take 1 tablet (10 mg total) by mouth daily. NEEDS PASS Continue all antihypertensives as prescribed.  Remember to bring in your blood pressure log with you for your follow up appointment.  DASH/Mediterranean Diets are healthier choices for HTN.    Mixed hyperlipidemia -     atorvastatin (LIPITOR) 20 MG tablet; Take 1 tablet (20 mg total) by mouth daily. NEEDS PASS INSTRUCTIONS: Work on a low fat, heart healthy diet and participate in regular aerobic exercise program by working out at least 150 minutes per week; 5 days a week-30 minutes per day. Avoid red meat/beef/steak,  fried foods. junk foods, sodas, sugary drinks, unhealthy snacking, alcohol and smoking.  Drink at least 80 oz of water per day and monitor your carbohydrate intake daily.    Elevated serum creatinine -     Basic metabolic panel  Erectile dysfunction, unspecified erectile dysfunction type -     sildenafil (VIAGRA) 100 MG tablet; Take 0.5-1 tablets (50-100 mg total) by mouth daily as needed for erectile dysfunction.    Patient has been counseled on age-appropriate routine health concerns for screening and prevention. These are reviewed and up-to-date. Referrals have been placed accordingly. Immunizations are up-to-date or declined.    Subjective:   Chief Complaint  Patient presents with  . Hypertension   HPI Gary Villanueva 55 y.o. male presents to office today for follow up. He has a past medical history of Gout and Hypertension.  Essential Hypertension He is currently taking amlodipine 5 mg. Will increase to 10 mg and add lisinopril 10 mg daily. Denies chest pain, shortness of breath, palpitations, lightheadedness,  dizziness, headaches or BLE edema.  BP Readings from Last 3 Encounters:  08/16/20 (!) 162/109  07/06/20 (!) 171/107  07/05/20 (!) 161/100   The 10-year ASCVD risk score Denman George DC Montez Hageman., et al., 2013) is: 16.4%   Values used to calculate the score:     Age: 76 years     Sex: Male     Is Non-Hispanic African American: Yes     Diabetic: No     Tobacco smoker: No     Systolic Blood Pressure: 162 mmHg     Is BP treated: Yes     HDL Cholesterol: 53 mg/dL     Total Cholesterol: 201 mg/dL Taking atorvastatin 20 mg daily as prescribed.  Review of Systems  Constitutional: Negative for fever, malaise/fatigue and weight loss.  HENT: Negative.  Negative for nosebleeds.   Eyes: Negative.  Negative for blurred vision, double vision and photophobia.  Respiratory: Negative.  Negative for cough and shortness of breath.   Cardiovascular: Negative.  Negative for chest pain, palpitations and leg swelling.  Gastrointestinal: Negative.  Negative for heartburn, nausea and vomiting.  Musculoskeletal: Negative.  Negative for myalgias.  Neurological: Negative.  Negative for dizziness, focal weakness, seizures and headaches.  Psychiatric/Behavioral: Negative.  Negative for suicidal ideas.    Past Medical History:  Diagnosis Date  . Gout   . Hypertension     Past Surgical History:  Procedure Laterality Date  . FOOT SURGERY    . QUADRICEPS TENDON REPAIR      Family History  Problem Relation Age  of Onset  . Cancer Mother     Social History Reviewed with no changes to be made today.   Outpatient Medications Prior to Visit  Medication Sig Dispense Refill  . aspirin EC 81 MG tablet Take 1 tablet (81 mg total) by mouth daily. 30 tablet 1  . allopurinol (ZYLOPRIM) 100 MG tablet Take 2 tablets (200 mg total) by mouth daily for 30 days. (Patient not taking: Reported on 07/06/2020) 60 tablet 1  . amLODipine (NORVASC) 5 MG tablet Take 1 tablet (5 mg total) by mouth daily. 30 tablet 0  . atorvastatin  (LIPITOR) 20 MG tablet Take 1 tablet (20 mg total) by mouth daily. 90 tablet 0  . cyclobenzaprine (FLEXERIL) 10 MG tablet Take 1 tablet (10 mg total) by mouth 2 (two) times daily as needed for muscle spasms. (Patient not taking: No sig reported) 40 tablet 1  . indomethacin (INDOCIN) 50 MG capsule Take 1 capsule (50 mg total) by mouth 3 (three) times daily with meals. 30 capsule 0  . lisinopril (ZESTRIL) 10 MG tablet Take 1 tablet (10 mg total) by mouth daily. (Patient not taking: Reported on 07/06/2020) 30 tablet 2  . meclizine (ANTIVERT) 25 MG tablet Take 1 tablet (25 mg total) by mouth 3 (three) times daily as needed for dizziness. (Patient not taking: No sig reported) 30 tablet 0  . nitroGLYCERIN (NITROSTAT) 0.4 MG SL tablet Place 1 tablet (0.4 mg total) under the tongue every 5 (five) minutes as needed for chest pain. Call 911 if symptoms do not resolve after 3 doses (Patient not taking: No sig reported) 50 tablet 1   No facility-administered medications prior to visit.    No Known Allergies     Objective:    BP (!) 162/109 (BP Location: Left Arm, Patient Position: Sitting, Cuff Size: Large)   Pulse 70   Resp 16   Ht 6' 1.5" (1.867 m)   Wt 239 lb 12.8 oz (108.8 kg)   SpO2 96%   BMI 31.21 kg/m  Wt Readings from Last 3 Encounters:  08/16/20 239 lb 12.8 oz (108.8 kg)  07/06/20 237 lb (107.5 kg)  07/05/20 232 lb (105.2 kg)    Physical Exam Vitals and nursing note reviewed.  Constitutional:      Appearance: He is well-developed.  HENT:     Head: Normocephalic and atraumatic.  Cardiovascular:     Rate and Rhythm: Normal rate and regular rhythm.     Heart sounds: Normal heart sounds. No murmur heard. No friction rub. No gallop.   Pulmonary:     Effort: Pulmonary effort is normal. No tachypnea or respiratory distress.     Breath sounds: Normal breath sounds. No decreased breath sounds, wheezing, rhonchi or rales.  Chest:     Chest wall: No tenderness.  Abdominal:     General:  Bowel sounds are normal.     Palpations: Abdomen is soft.  Musculoskeletal:        General: Normal range of motion.     Cervical back: Normal range of motion.  Skin:    General: Skin is warm and dry.  Neurological:     Mental Status: He is alert and oriented to person, place, and time.     Coordination: Coordination normal.  Psychiatric:        Behavior: Behavior normal. Behavior is cooperative.        Thought Content: Thought content normal.        Judgment: Judgment normal.  Patient has been counseled extensively about nutrition and exercise as well as the importance of adherence with medications and regular follow-up. The patient was given clear instructions to go to ER or return to medical center if symptoms don't improve, worsen or new problems develop. The patient verbalized understanding.   Follow-up: Return for LUKE 3 weeks BP check. See me in 4 months.   Claiborne Rigg, FNP-BC [facility] [place], [place] [phone]   08/16/2020, 2:42 PM

## 2020-08-17 LAB — BASIC METABOLIC PANEL
BUN/Creatinine Ratio: 13 (ref 9–20)
BUN: 15 mg/dL (ref 6–24)
CO2: 23 mmol/L (ref 20–29)
Calcium: 9.2 mg/dL (ref 8.7–10.2)
Chloride: 104 mmol/L (ref 96–106)
Creatinine, Ser: 1.15 mg/dL (ref 0.76–1.27)
Glucose: 93 mg/dL (ref 65–99)
Potassium: 4.8 mmol/L (ref 3.5–5.2)
Sodium: 143 mmol/L (ref 134–144)
eGFR: 75 mL/min/{1.73_m2} (ref >59)

## 2020-08-19 LAB — FECAL OCCULT BLOOD, IMMUNOCHEMICAL: Fecal Occult Bld: NEGATIVE

## 2020-08-21 ENCOUNTER — Telehealth: Payer: Self-pay

## 2020-08-21 NOTE — Telephone Encounter (Signed)
Contacted pt to go over Fit results pt is aware and doesn't have any questions or concerns

## 2020-09-04 NOTE — Progress Notes (Signed)
S:     PCP: Bertram Denver PMH: HTN, gout  Patient arrives in good spirits. Presents to the clinic for hypertension evaluation, counseling, and management. Patient was referred and last seen by Primary Care Provider on 08/16/20. At that visit, BP was 162/109. Medication changes included increasing amlodipine to 10 mg daily and initiating lisinopril 10 mg daily.    Today, patient reports medication adherence with amlodipine 10 mg daily and lisinopril 10 mg daily in the morning. Denies dizziness, headaches, blurred vision, and LE swelling. Reports not checking BP at home. Discussed diet and exercise regimen below. Additionally, reports sharp pain on left foot that has been occurring for the last 4-5 days - pt doesn't think it's gout, but may be related to being on his feet all day since returning back to work last week. Denies foot swelling.   Medication adherence reported good.  Current BP Medications include: Amlodipine 10 mg daily (AM), lisinopril 10 mg daily (AM)  Dietary habits include: fruits, cutting back on red meats, vegetables, chicken, no pork, baked fish; drinks water,  Exercise habits include: getting ready to start with [employer] Family / Social history: -Fhx: cancer in mother -Tobacco use: denies -Alcohol use: wine, [employer] lite   O:  Vitals:   09/06/20 0859  BP: 136/80  Pulse: 62    Home BP readings: not checking   Last 3 Office BP readings: BP Readings from Last 3 Encounters:  09/06/20 136/80  08/16/20 (!) 162/109  07/06/20 (!) 171/107    BMET    Component Value Date/Time   NA 143 08/16/2020 1451   K 4.8 08/16/2020 1451   CL 104 08/16/2020 1451   CO2 23 08/16/2020 1451   GLUCOSE 93 08/16/2020 1451   BUN 15 08/16/2020 1451   CREATININE 1.15 08/16/2020 1451   CALCIUM 9.2 08/16/2020 1451   GFRNONAA 72 09/28/2018 1437   GFRAA 84 09/28/2018 1437    Renal function: CrCl cannot be calculated (Unknown ideal weight.).  Clinical ASCVD: No  The 10-year  ASCVD risk score Denman George DC Jr., et al., 2013) is: 12%   Values used to calculate the score:     Age: 55 years     Sex: Male     Is Non-Hispanic African American: Yes     Diabetic: No     Tobacco smoker: No     Systolic Blood Pressure: 136 mmHg     Is BP treated: Yes     HDL Cholesterol: 53 mg/dL     Total Cholesterol: 201 mg/dL  A/P: Hypertension longstanding currently near goal on current medications. BP Goal = <130/80 mmHg. Medication adherence appears good. Encouraged patient to continue to take medications as prescribed and to check BP 3-4x/weekly at least 1 hour after taking medications. Patient verbalized understanding. Encouraged patient to aim for a diet full of vegetables, fruits and lean meats (chicken, [place], fish) and to limit salt intake by eating fresh or frozen vegetables (instead of canned), rinse canned vegetables prior to cooking and do not add any additional salt to meals. Encouraged patient to exercise 20-30 minutes daily with the goal of 150 minutes per week. Patient verbalized understanding. Additionally, encouraged patient to contact PCP if foot pain persists. Patient verbalized understanding. -No medication changes today -Continued lisinopril 10 mg daily -Continued amlodipine 10 mg daily -F/u labs ordered - BMET next visit -Counseled on lifestyle modifications for blood pressure control including reduced dietary sodium, increased exercise, adequate sleep.  Results reviewed and written information provided. Total  time in face-to-face counseling 20 minutes.   F/U Clinic Visit in 6 weeks.   Fabio Neighbors, PharmD, BCPS PGY2 Ambulatory Care Resident [facility]

## 2020-09-06 ENCOUNTER — Ambulatory Visit: Payer: Self-pay | Attending: Family Medicine | Admitting: Pharmacist

## 2020-09-06 ENCOUNTER — Encounter: Payer: Self-pay | Admitting: Pharmacist

## 2020-09-06 ENCOUNTER — Other Ambulatory Visit: Payer: Self-pay

## 2020-09-06 VITALS — BP 136/80 | HR 62

## 2020-09-06 DIAGNOSIS — I10 Essential (primary) hypertension: Secondary | ICD-10-CM

## 2020-09-13 ENCOUNTER — Other Ambulatory Visit: Payer: Self-pay

## 2020-09-13 ENCOUNTER — Ambulatory Visit: Payer: Self-pay | Attending: Nurse Practitioner

## 2020-10-05 ENCOUNTER — Telehealth: Payer: Self-pay | Admitting: Nurse Practitioner

## 2020-10-05 NOTE — Telephone Encounter (Signed)
Pt was sent a letter from financial dept. Inform them, that the application they submitted was incomplete, since they were missing some documentation at the time of the appointment, Pt need to reschedule and resubmit all new papers and application for [employer] and [employer], P.S. old documents has been sent back by mail to the Pt and Pt. need to make a new appt. 

## 2020-10-16 NOTE — Progress Notes (Signed)
S:     PCP: Bertram Denver PMH: HTN, gout  Patient arrives in good spirits. Presents to the clinic for hypertension evaluation, counseling, and management. Patient was referred and last seen by Primary Care Provider on 08/16/20. At that visit, BP was 162/109. Medication changes included increasing amlodipine to 10 mg daily and initiating lisinopril 10 mg daily. Last seen by pharmacy clinic 09/06/20 at which time BP was much improved to 136/80 but denied checking BP at home. No medication changes were made since BP was close to goal and patient was encouraged to start checking BP at home.   Today, patient arrives in good spirits and asks to weigh himself since he has been working on losing weight, which showed he has lost ~12 lbs since May. Endorses improvements in diet and increased exercise. He is taking amlodipine and lisinopril daily with no missed doses or adverse effects reported but notes he needs to pick up refills of these today. He has not been checking his blood pressures regularly at home. Checked it once a few weeks ago and it was 176/79. He is using a wrist cuff but does not have appropriate technique. Denies dizziness, headaches, blurred vision, and swelling.   Medication adherence reported good.  Current BP Medications include:  -Amlodipine 10 mg daily (AM) -Lisinopril 10 mg daily (AM)  Dietary habits include: fruits, cutting back on red meats, incorporating more vegetables, chicken, no pork, baked fish; drinks water  Exercise habits include: active at work, does exercises at home (push ups, sit ups, bicep curls)  Family / Social history: -Fhx: cancer in mother -Tobacco use: denies -Alcohol use: wine, [employer] lite   O:  Vitals:   10/18/20 1605  BP: 140/78  Pulse: 71     Home BP readings: only checked once since last visit - 176/79. Using wrist cuff.   Last 3 Office BP readings: BP Readings from Last 3 Encounters:  09/06/20 136/80  08/16/20 (!) 162/109  07/06/20 (!)  171/107    BMET    Component Value Date/Time   NA 143 08/16/2020 1451   K 4.8 08/16/2020 1451   CL 104 08/16/2020 1451   CO2 23 08/16/2020 1451   GLUCOSE 93 08/16/2020 1451   BUN 15 08/16/2020 1451   CREATININE 1.15 08/16/2020 1451   CALCIUM 9.2 08/16/2020 1451   GFRNONAA 72 09/28/2018 1437   GFRAA 84 09/28/2018 1437    Renal function: CrCl cannot be calculated (Patient's most recent lab result is older than the maximum 21 days allowed.).  Clinical ASCVD: No  The 10-year ASCVD risk score Denman George DC Jr., et al., 2013) is: 12%   Values used to calculate the score:     Age: 55 years     Sex: Male     Is Non-Hispanic African American: Yes     Diabetic: No     Tobacco smoker: No     Systolic Blood Pressure: 136 mmHg     Is BP treated: Yes     HDL Cholesterol: 53 mg/dL     Total Cholesterol: 201 mg/dL  A/P: Hypertension longstanding currently near goal on current medications. BP Goal = <130/80 mmHg. Medication adherence appears good. Given that BP is still not at goal, will plan to titrate lisinopril if BMET is stable. Encouraged patient to start checking BP once daily at least 1 hour after taking medications. Patient verbalized understanding and stated he would do this in the evenings when he gets home from work. Encouraged him to bring  his cuff to his next visit to provide a log of values and so we can check his machine with ours. Reviewed appropriate wrist cuff technique (resting arm across chest when checking rather than letting arm hang at side) and he confirmed understanding.  -Continued lisinopril 10 mg daily and amlodipine 10 mg daily -Check BMET today. If renal function and electrolytes are stable, will increase lisinopril to 20 mg daily for additional blood pressure control.  -Counseled on lifestyle modifications for blood pressure control including reduced dietary sodium, increased exercise, adequate sleep. Encouraged him on weight loss success and educated him that losing  weight will also help improve his BP.   Results reviewed and written information provided. Total time in face-to-face counseling 25 minutes.   F/U Pharmacist Clinic Visit in 1 month. Will check repeat BMET at that time if lisinopril dose is increased.   Pervis Hocking, PharmD PGY2 Ambulatory Care Pharmacy Resident 10/18/2020 4:51 PM

## 2020-10-18 ENCOUNTER — Other Ambulatory Visit: Payer: Self-pay

## 2020-10-18 ENCOUNTER — Ambulatory Visit: Payer: Self-pay | Attending: Nurse Practitioner | Admitting: Pharmacist

## 2020-10-18 VITALS — BP 140/78 | HR 71 | Wt 227.6 lb

## 2020-10-18 DIAGNOSIS — I10 Essential (primary) hypertension: Secondary | ICD-10-CM

## 2020-10-18 DIAGNOSIS — N529 Male erectile dysfunction, unspecified: Secondary | ICD-10-CM

## 2020-10-18 MED ORDER — SILDENAFIL CITRATE 100 MG PO TABS
50.0000 mg | ORAL_TABLET | Freq: Every day | ORAL | 11 refills | Status: DC | PRN
  Filled 2020-10-18: qty 10, 30d supply, fill #0
  Filled 2020-12-07: qty 6, 23d supply, fill #1

## 2020-10-18 NOTE — Patient Instructions (Signed)
It was nice to see you today!  Your goal blood pressure is less than 130/80 mmHg. In clinic, your blood pressure was 140/78 mmHg.  Medication Changes: Continue amlodipine 10 mg daily and lisinopril 10 mg daily  We are checking lab work today. If everything is stable, we will plan to increase your lisinopril but we will call you prior to that to let you know of any changes.   Monitor blood pressure at home daily and keep a log (on your phone or piece of paper) to bring with you to your next visit. Write down date, time, blood pressure and pulse.  Keep up the good work with diet and exercise. Aim for a diet full of vegetables, fruit and lean meats (chicken, [place], fish). Try to limit salt intake by eating fresh or frozen vegetables (instead of canned), rinse canned vegetables prior to cooking and do not add any additional salt to meals.

## 2020-10-19 ENCOUNTER — Encounter: Payer: Self-pay | Admitting: Pharmacist

## 2020-10-19 LAB — CMP14+EGFR
ALT: 25 IU/L (ref 0–44)
AST: 23 IU/L (ref 0–40)
Albumin/Globulin Ratio: 1.9 (ref 1.2–2.2)
Albumin: 4.6 g/dL (ref 3.8–4.9)
Alkaline Phosphatase: 57 IU/L (ref 44–121)
BUN/Creatinine Ratio: 13 (ref 9–20)
BUN: 18 mg/dL (ref 6–24)
Bilirubin Total: 0.3 mg/dL (ref 0.0–1.2)
CO2: 24 mmol/L (ref 20–29)
Calcium: 9.8 mg/dL (ref 8.7–10.2)
Chloride: 102 mmol/L (ref 96–106)
Creatinine, Ser: 1.39 mg/dL — ABNORMAL HIGH (ref 0.76–1.27)
Globulin, Total: 2.4 g/dL (ref 1.5–4.5)
Glucose: 95 mg/dL (ref 65–99)
Potassium: 4.6 mmol/L (ref 3.5–5.2)
Sodium: 141 mmol/L (ref 134–144)
Total Protein: 7 g/dL (ref 6.0–8.5)
eGFR: 60 mL/min/{1.73_m2} (ref >59)

## 2020-10-20 ENCOUNTER — Other Ambulatory Visit: Payer: Self-pay

## 2020-10-20 ENCOUNTER — Telehealth: Payer: Self-pay

## 2020-10-20 NOTE — Telephone Encounter (Signed)
Medication is ready for pick up in pharmacy.

## 2020-10-27 ENCOUNTER — Other Ambulatory Visit: Payer: Self-pay

## 2020-11-08 ENCOUNTER — Other Ambulatory Visit: Payer: Self-pay

## 2020-11-09 ENCOUNTER — Other Ambulatory Visit: Payer: Self-pay

## 2020-11-20 ENCOUNTER — Ambulatory Visit: Payer: Self-pay | Admitting: Pharmacist

## 2020-12-07 ENCOUNTER — Ambulatory Visit: Payer: 59 | Attending: Nurse Practitioner | Admitting: Pharmacist

## 2020-12-07 ENCOUNTER — Encounter: Payer: Self-pay | Admitting: Pharmacist

## 2020-12-07 ENCOUNTER — Other Ambulatory Visit: Payer: Self-pay

## 2020-12-07 VITALS — BP 130/77 | HR 67 | Wt 223.0 lb

## 2020-12-07 DIAGNOSIS — I10 Essential (primary) hypertension: Secondary | ICD-10-CM

## 2020-12-07 MED ORDER — LISINOPRIL 20 MG PO TABS
20.0000 mg | ORAL_TABLET | Freq: Every day | ORAL | 2 refills | Status: DC
Start: 2020-12-07 — End: 2021-07-04
  Filled 2020-12-07: qty 30, 30d supply, fill #0

## 2020-12-07 NOTE — Progress Notes (Signed)
   S:     PCP: Bertram Denver PMH: HTN, gout  Patient arrives in good spirits. Presents to the clinic for hypertension evaluation, counseling, and management. Patient was referred and last seen by Primary Care Provider on 08/16/20. Last seen by pharmacy clinic 10/18/20 at which time BP was 140/78 which is up from the previous pharmacy clinic at 136/80.  Today, patient arrives in good spirits and reports he has not missed any of his medications but he has not started checking his BP at home after being instructed to do so at his last visit. Reasoning per patient is that he has been 'lazy' and 'hasn't gotten around to doing it'.  Medication adherence reported good.  Current BP Medications include:  -Amlodipine 10 mg daily (AM) -Lisinopril 20 mg daily (AM)  Dietary habits include: fruits, cutting back on red meats, incorporating more vegetables, chicken, no pork, baked fish; drinks water  Exercise habits include: active at work, does exercises at home (push ups, sit ups, bicep curls)  Family / Social history: -Fhx: cancer in mother -Tobacco use: denies -Alcohol use: wine, [employer] lite   O:  Vitals:   12/07/20 1622  BP: 130/77  Pulse: 67   Home BP readings: has not checked since last visit   Last 3 Office BP readings: BP Readings from Last 3 Encounters:  12/07/20 130/77  10/18/20 140/78  09/06/20 136/80    BMET    Component Value Date/Time   NA 141 10/18/2020 1629   K 4.6 10/18/2020 1629   CL 102 10/18/2020 1629   CO2 24 10/18/2020 1629   GLUCOSE 95 10/18/2020 1629   BUN 18 10/18/2020 1629   CREATININE 1.39 (H) 10/18/2020 1629   CALCIUM 9.8 10/18/2020 1629   GFRNONAA 72 09/28/2018 1437   GFRAA 84 09/28/2018 1437    Renal function: CrCl cannot be calculated (Patient's most recent lab result is older than the maximum 21 days allowed.).  Clinical ASCVD: No  The 10-year ASCVD risk score (Arnett DK, et al., 2019) is: 11.1%   Values used to calculate the score:     Age:  55 years     Sex: Male     Is Non-Hispanic African American: Yes     Diabetic: No     Tobacco smoker: No     Systolic Blood Pressure: 130 mmHg     Is BP treated: Yes     HDL Cholesterol: 53 mg/dL     Total Cholesterol: 201 mg/dL  A/P: Hypertension longstanding currently at goal on current medications. BP Goal = <130/80 mmHg. Medication adherence appears good. Patient ensures he will make it a priority to start checking home blood pressure. Given a recent increase in Scr, will recheck a BMET today to ensure it has not increased >30% since increasing lisinopril. -Continued lisinopril 20 mg daily and amlodipine 10 mg daily -BMET today -Counseled on lifestyle modifications for blood pressure control including reduced dietary sodium, increased exercise, adequate sleep.  -Encouraged him on weight loss success and educated him that losing weight will also help improve his BP.   Results reviewed and written information provided. Total time in face-to-face counseling 25 minutes.    F/U PCP in 2 weeks.  Rushie Goltz, Pharm.D. PGY-1 Pharmacy Resident [phone] 12/07/2020 4:24 PM

## 2020-12-08 LAB — CMP14+EGFR
ALT: 33 IU/L (ref 0–44)
AST: 25 IU/L (ref 0–40)
Albumin/Globulin Ratio: 1.8 (ref 1.2–2.2)
Albumin: 4.4 g/dL (ref 3.8–4.9)
Alkaline Phosphatase: 49 IU/L (ref 44–121)
BUN/Creatinine Ratio: 20 (ref 9–20)
BUN: 25 mg/dL — ABNORMAL HIGH (ref 6–24)
Bilirubin Total: 0.3 mg/dL (ref 0.0–1.2)
CO2: 23 mmol/L (ref 20–29)
Calcium: 9.4 mg/dL (ref 8.7–10.2)
Chloride: 103 mmol/L (ref 96–106)
Creatinine, Ser: 1.26 mg/dL (ref 0.76–1.27)
Globulin, Total: 2.5 g/dL (ref 1.5–4.5)
Glucose: 91 mg/dL (ref 65–99)
Potassium: 4.8 mmol/L (ref 3.5–5.2)
Sodium: 140 mmol/L (ref 134–144)
Total Protein: 6.9 g/dL (ref 6.0–8.5)
eGFR: 67 mL/min/{1.73_m2} (ref >59)

## 2020-12-18 ENCOUNTER — Ambulatory Visit: Payer: Self-pay | Admitting: Nurse Practitioner

## 2021-07-04 ENCOUNTER — Ambulatory Visit: Payer: 59 | Attending: Nurse Practitioner | Admitting: Nurse Practitioner

## 2021-07-04 ENCOUNTER — Other Ambulatory Visit: Payer: Self-pay

## 2021-07-04 ENCOUNTER — Encounter: Payer: Self-pay | Admitting: Nurse Practitioner

## 2021-07-04 VITALS — BP 150/95 | HR 64 | Wt 220.6 lb

## 2021-07-04 DIAGNOSIS — Z1211 Encounter for screening for malignant neoplasm of colon: Secondary | ICD-10-CM

## 2021-07-04 DIAGNOSIS — Z1159 Encounter for screening for other viral diseases: Secondary | ICD-10-CM

## 2021-07-04 DIAGNOSIS — N529 Male erectile dysfunction, unspecified: Secondary | ICD-10-CM | POA: Diagnosis not present

## 2021-07-04 DIAGNOSIS — Z7982 Long term (current) use of aspirin: Secondary | ICD-10-CM

## 2021-07-04 DIAGNOSIS — E782 Mixed hyperlipidemia: Secondary | ICD-10-CM | POA: Diagnosis not present

## 2021-07-04 DIAGNOSIS — I10 Essential (primary) hypertension: Secondary | ICD-10-CM

## 2021-07-04 MED ORDER — SILDENAFIL CITRATE 100 MG PO TABS
50.0000 mg | ORAL_TABLET | Freq: Every day | ORAL | 11 refills | Status: DC | PRN
  Filled 2021-07-04: qty 10, 30d supply, fill #0

## 2021-07-04 MED ORDER — LISINOPRIL 20 MG PO TABS
20.0000 mg | ORAL_TABLET | Freq: Every day | ORAL | 2 refills | Status: DC
  Filled 2021-07-04: qty 30, 30d supply, fill #0

## 2021-07-04 MED ORDER — ATORVASTATIN CALCIUM 20 MG PO TABS
20.0000 mg | ORAL_TABLET | Freq: Every day | ORAL | 3 refills | Status: DC
  Filled 2021-07-04: qty 30, 30d supply, fill #0

## 2021-07-04 MED ORDER — AMLODIPINE BESYLATE 10 MG PO TABS
10.0000 mg | ORAL_TABLET | Freq: Every day | ORAL | 1 refills | Status: DC
  Filled 2021-07-04: qty 30, 30d supply, fill #0

## 2021-07-04 MED ORDER — ASPIRIN EC 81 MG PO TBEC
81.0000 mg | DELAYED_RELEASE_TABLET | Freq: Every day | ORAL | 1 refills | Status: DC
  Filled 2021-07-04: qty 30, 30d supply, fill #0

## 2021-07-04 NOTE — Progress Notes (Signed)
? ?Assessment & Plan:  ?Gary Villanueva was seen today for hypertension and medication refill. ? ?Diagnoses and all orders for this visit: ? ?Essential hypertension ?-     CMP14+EGFR ?-     amLODipine (NORVASC) 10 MG tablet; Take 1 tablet (10 mg total) by mouth daily. ?-     lisinopril (ZESTRIL) 20 MG tablet; Take 1 tablet (20 mg total) by mouth daily. ? ?Mixed hyperlipidemia ?-     atorvastatin (LIPITOR) 20 MG tablet; Take 1 tablet (20 mg total) by mouth daily. ? ?Erectile dysfunction, unspecified erectile dysfunction type ?-     sildenafil (VIAGRA) 100 MG tablet; Take 0.5-1 tablets (50-100 mg total) by mouth daily as needed for erectile dysfunction. ? ?Aspirin long-term use ?-     aspirin EC 81 MG tablet; Take 1 tablet (81 mg total) by mouth daily. ?-     CBC ? ?Need for hepatitis C screening test ?-     HCV Ab w Reflex to Quant PCR ?-     Interpretation: ? ?Colon cancer screening ?-     Ambulatory referral to Gastroenterology ? ? ? ?Patient has been counseled on age-appropriate routine health concerns for screening and prevention. These are reviewed and up-to-date. Referrals have been placed accordingly. Immunizations are up-to-date or declined.    ?Subjective:  ? ?Chief Complaint  ?Patient presents with  ? Hypertension  ? Medication Refill  ? ?HPI ?Gary Villanueva 56 y.o. male presents to office today for HTN.  ?He  has a past medical history of Erectile dysfunction, Gout, and Hypertension.  ? ?He has been out of his blood pressure medications for quite some time. Blood pressure is elevated today. Will refill amlodipine 10 mg and lisinopril 20 mg today. He does not monitor his blood pressure at home.  ?BP Readings from Last 3 Encounters:  ?07/04/21 (!) 150/95  ?12/07/20 130/77  ?10/18/20 140/78  ?  ? ?Review of Systems  ?Constitutional:  Negative for fever, malaise/fatigue and weight loss.  ?HENT: Negative.  Negative for nosebleeds.   ?Eyes: Negative.  Negative for blurred vision, double vision and photophobia.   ?Respiratory: Negative.  Negative for cough and shortness of breath.   ?Cardiovascular: Negative.  Negative for chest pain, palpitations and leg swelling.  ?Gastrointestinal: Negative.  Negative for heartburn, nausea and vomiting.  ?Musculoskeletal: Negative.  Negative for myalgias.  ?Neurological: Negative.  Negative for dizziness, focal weakness, seizures and headaches.  ?Psychiatric/Behavioral: Negative.  Negative for suicidal ideas.   ? ?Past Medical History:  ?Diagnosis Date  ? Erectile dysfunction   ? Gout   ? Hypertension   ? ? ?Past Surgical History:  ?Procedure Laterality Date  ? FOOT SURGERY    ? QUADRICEPS TENDON REPAIR    ? ? ?Family History  ?Problem Relation Age of Onset  ? Cancer Mother   ? ? ?Social History Reviewed with no changes to be made today.  ? ?Outpatient Medications Prior to Visit  ?Medication Sig Dispense Refill  ? amLODipine (NORVASC) 10 MG tablet Take 1 tablet (10 mg total) by mouth daily. 90 tablet 1  ? aspirin EC 81 MG tablet Take 1 tablet (81 mg total) by mouth daily. 30 tablet 1  ? atorvastatin (LIPITOR) 20 MG tablet Take 1 tablet (20 mg total) by mouth daily. NEEDS PASS 90 tablet 3  ? lisinopril (ZESTRIL) 20 MG tablet Take 1 tablet (20 mg total) by mouth daily. 30 tablet 2  ? sildenafil (VIAGRA) 100 MG tablet Take 0.5-1 tablets (50-100 mg total)  by mouth daily as needed for erectile dysfunction. 10 tablet 11  ? ?No facility-administered medications prior to visit.  ? ? ?No Known Allergies ? ?   ?Objective:  ?  ?BP (!) 150/95   Pulse 64   Wt 220 lb 9.6 oz (100.1 kg)   SpO2 95%   BMI 28.71 kg/m?  ?Wt Readings from Last 3 Encounters:  ?07/04/21 220 lb 9.6 oz (100.1 kg)  ?12/07/20 223 lb (101.2 kg)  ?10/18/20 227 lb 9.6 oz (103.2 kg)  ? ? ?Physical Exam ?Vitals and nursing note reviewed.  ?Constitutional:   ?   Appearance: He is well-developed.  ?HENT:  ?   Head: Normocephalic and atraumatic.  ?Cardiovascular:  ?   Rate and Rhythm: Normal rate and regular rhythm.  ?   Heart sounds:  Normal heart sounds. No murmur heard. ?  No friction rub. No gallop.  ?Pulmonary:  ?   Effort: Pulmonary effort is normal. No tachypnea or respiratory distress.  ?   Breath sounds: Normal breath sounds. No decreased breath sounds, wheezing, rhonchi or rales.  ?Chest:  ?   Chest wall: No tenderness.  ?Abdominal:  ?   General: Bowel sounds are normal.  ?   Palpations: Abdomen is soft.  ?Musculoskeletal:     ?   General: Normal range of motion.  ?   Cervical back: Normal range of motion.  ?Skin: ?   General: Skin is warm and dry.  ?Neurological:  ?   Mental Status: He is alert and oriented to person, place, and time.  ?   Coordination: Coordination normal.  ?Psychiatric:     ?   Behavior: Behavior normal. Behavior is cooperative.     ?   Thought Content: Thought content normal.     ?   Judgment: Judgment normal.  ? ? ? ? ?   ?Patient has been counseled extensively about nutrition and exercise as well as the importance of adherence with medications and regular follow-up. The patient was given clear instructions to go to ER or return to medical center if symptoms don't improve, worsen or new problems develop. The patient verbalized understanding.  ? ?Follow-up: Return in about 3 months (around 10/03/2021).  ? ?[name], FNP-BC ?[place] ?[place], [place] ?[phone]   ?07/05/2021, 7:25 PM ?

## 2021-07-05 ENCOUNTER — Encounter: Payer: Self-pay | Admitting: Nurse Practitioner

## 2021-07-05 LAB — HCV AB W REFLEX TO QUANT PCR: HCV Ab: NONREACTIVE

## 2021-07-05 LAB — CMP14+EGFR
ALT: 24 IU/L (ref 0–44)
AST: 22 IU/L (ref 0–40)
Albumin/Globulin Ratio: 2.1 (ref 1.2–2.2)
Albumin: 4.6 g/dL (ref 3.8–4.9)
Alkaline Phosphatase: 50 IU/L (ref 44–121)
BUN/Creatinine Ratio: 15 (ref 9–20)
BUN: 17 mg/dL (ref 6–24)
Bilirubin Total: 0.4 mg/dL (ref 0.0–1.2)
CO2: 24 mmol/L (ref 20–29)
Calcium: 9.6 mg/dL (ref 8.7–10.2)
Chloride: 101 mmol/L (ref 96–106)
Creatinine, Ser: 1.13 mg/dL (ref 0.76–1.27)
Globulin, Total: 2.2 g/dL (ref 1.5–4.5)
Glucose: 107 mg/dL — ABNORMAL HIGH (ref 70–99)
Potassium: 4.7 mmol/L (ref 3.5–5.2)
Sodium: 138 mmol/L (ref 134–144)
Total Protein: 6.8 g/dL (ref 6.0–8.5)
eGFR: 76 mL/min/{1.73_m2} (ref >59)

## 2021-07-05 LAB — CBC
Hematocrit: 45.8 % (ref 37.5–51.0)
Hemoglobin: 14.9 g/dL (ref 13.0–17.7)
MCH: 27.3 pg (ref 26.6–33.0)
MCHC: 32.5 g/dL (ref 31.5–35.7)
MCV: 84 fL (ref 79–97)
Platelets: 158 10*3/uL (ref 150–450)
RBC: 5.46 x10E6/uL (ref 4.14–5.80)
RDW: 12.5 % (ref 11.6–15.4)
WBC: 4.2 10*3/uL (ref 3.4–10.8)

## 2021-07-05 LAB — HCV INTERPRETATION

## 2021-12-17 ENCOUNTER — Encounter: Payer: Self-pay | Admitting: Nurse Practitioner

## 2021-12-18 ENCOUNTER — Ambulatory Visit: Payer: Self-pay | Attending: Nurse Practitioner | Admitting: Nurse Practitioner

## 2021-12-18 ENCOUNTER — Other Ambulatory Visit: Payer: Self-pay

## 2021-12-18 ENCOUNTER — Encounter: Payer: Self-pay | Admitting: Nurse Practitioner

## 2021-12-18 VITALS — BP 165/94 | HR 62 | Temp 98.0°F | Ht 73.0 in | Wt 220.0 lb

## 2021-12-18 DIAGNOSIS — I10 Essential (primary) hypertension: Secondary | ICD-10-CM

## 2021-12-18 DIAGNOSIS — M109 Gout, unspecified: Secondary | ICD-10-CM

## 2021-12-18 DIAGNOSIS — N529 Male erectile dysfunction, unspecified: Secondary | ICD-10-CM

## 2021-12-18 DIAGNOSIS — E782 Mixed hyperlipidemia: Secondary | ICD-10-CM

## 2021-12-18 DIAGNOSIS — M79672 Pain in left foot: Secondary | ICD-10-CM

## 2021-12-18 MED ORDER — SILDENAFIL CITRATE 100 MG PO TABS
50.0000 mg | ORAL_TABLET | Freq: Every day | ORAL | 11 refills | Status: DC | PRN
  Filled 2021-12-18: qty 8, 8d supply, fill #0

## 2021-12-18 MED ORDER — PREDNISONE 20 MG PO TABS
40.0000 mg | ORAL_TABLET | Freq: Every day | ORAL | 0 refills | Status: AC
  Filled 2021-12-18: qty 10, 5d supply, fill #0

## 2021-12-18 MED ORDER — AMLODIPINE BESYLATE 10 MG PO TABS
10.0000 mg | ORAL_TABLET | Freq: Every day | ORAL | 1 refills | Status: DC
  Filled 2021-12-18: qty 30, 30d supply, fill #0

## 2021-12-18 MED ORDER — ATORVASTATIN CALCIUM 20 MG PO TABS
20.0000 mg | ORAL_TABLET | Freq: Every day | ORAL | 3 refills | Status: DC
  Filled 2021-12-18: qty 30, 30d supply, fill #0

## 2021-12-18 MED ORDER — LISINOPRIL 20 MG PO TABS
20.0000 mg | ORAL_TABLET | Freq: Every day | ORAL | 2 refills | Status: DC
  Filled 2021-12-18: qty 30, 30d supply, fill #0

## 2021-12-18 NOTE — Progress Notes (Signed)
Assessment & Plan:  Tadarrius was seen today for gout.  Diagnoses and all orders for this visit:  Acute gout of left foot, unspecified cause -     Uric Acid -     CMP14+EGFR -     predniSONE (DELTASONE) 20 MG tablet; Take 2 tablets (40 mg total) by mouth daily with breakfast for 5 days.  Left foot pain -     Uric Acid -     CMP14+EGFR -     predniSONE (DELTASONE) 20 MG tablet; Take 2 tablets (40 mg total) by mouth daily with breakfast for 5 days.  Essential hypertension -     lisinopril (ZESTRIL) 20 MG tablet; Take 1 tablet (20 mg total) by mouth daily. -     amLODipine (NORVASC) 10 MG tablet; Take 1 tablet (10 mg total) by mouth daily.  Mixed hyperlipidemia -     atorvastatin (LIPITOR) 20 MG tablet; Take 1 tablet (20 mg total) by mouth daily.  Erectile dysfunction, unspecified erectile dysfunction type -     sildenafil (VIAGRA) 100 MG tablet; Take 0.5-1 tablets (50-100 mg total) by mouth daily as needed for erectile dysfunction.    Patient has been counseled on age-appropriate routine health concerns for screening and prevention. These are reviewed and up-to-date. Referrals have been placed accordingly. Immunizations are up-to-date or declined.    Subjective:   Chief Complaint  Patient presents with   Gout   HPI Gary Villanueva 56 y.o. male presents to office today for acute gout flare  He has a past medical history of Erectile dysfunction, Gout, and Hypertension.   Gout: Patient here for evaluation of acute gouty arthritis. The patient reports onset of an acute gout attack involving the left foot beginning several days, and being treated with OTC NSAIDs. Limitation on activities include difficulty with walking. The patient is not avoiding high purine foods  Reports pain initially on Friday was  7-8/10 and today 5/10  HTN Blood pressure is elevated. He is not taking any of his medications as prescribed. States he does not like taking medicines. Wants to know which ones  "I really need to take". I instructed him to take ALL of his medications as prescribed. BP Readings from Last 3 Encounters:  12/18/21 (!) 165/94  07/04/21 (!) 150/95  12/07/20 130/77     The 10-year ASCVD risk score (Arnett DK, et al., 2019) is: 17.7%   Values used to calculate the score:     Age: 56 years     Sex: Male     Is Non-Hispanic African American: Yes     Diabetic: No     Tobacco smoker: No     Systolic Blood Pressure: 431 mmHg     Is BP treated: Yes     HDL Cholesterol: 53 mg/dL     Total Cholesterol: 201 mg/dL  Review of Systems  Constitutional:  Negative for fever, malaise/fatigue and weight loss.  HENT: Negative.  Negative for nosebleeds.   Eyes: Negative.  Negative for blurred vision, double vision and photophobia.  Respiratory: Negative.  Negative for cough and shortness of breath.   Cardiovascular: Negative.  Negative for chest pain, palpitations and leg swelling.  Gastrointestinal: Negative.  Negative for heartburn, nausea and vomiting.  Musculoskeletal:  Positive for joint pain. Negative for myalgias.  Neurological: Negative.  Negative for dizziness, focal weakness, seizures and headaches.  Psychiatric/Behavioral: Negative.  Negative for suicidal ideas.     Past Medical History:  Diagnosis Date   Erectile  dysfunction    Gout    Hyperlipidemia    Hypertension     Past Surgical History:  Procedure Laterality Date   FOOT SURGERY     QUADRICEPS TENDON REPAIR      Family History  Problem Relation Age of Onset   Cancer Mother     Social History Reviewed with no changes to be made today.   Outpatient Medications Prior to Visit  Medication Sig Dispense Refill   aspirin EC 81 MG tablet Take 1 tablet (81 mg total) by mouth daily. (Patient not taking: Reported on 12/18/2021) 30 tablet 1   amLODipine (NORVASC) 10 MG tablet Take 1 tablet (10 mg total) by mouth daily. (Patient not taking: Reported on 12/18/2021) 90 tablet 1   atorvastatin (LIPITOR) 20 MG  tablet Take 1 tablet (20 mg total) by mouth daily. (Patient not taking: Reported on 12/18/2021) 90 tablet 3   lisinopril (ZESTRIL) 20 MG tablet Take 1 tablet (20 mg total) by mouth daily. (Patient not taking: Reported on 12/18/2021) 30 tablet 2   sildenafil (VIAGRA) 100 MG tablet Take 0.5-1 tablets (50-100 mg total) by mouth daily as needed for erectile dysfunction. (Patient not taking: Reported on 12/18/2021) 10 tablet 11   No facility-administered medications prior to visit.    No Known Allergies     Objective:    BP (!) 165/94   Pulse 62   Temp 98 F (36.7 C) (Oral)   Ht '6\' 1"'  (1.854 m)   Wt 220 lb (99.8 kg)   SpO2 97%   BMI 29.03 kg/m  Wt Readings from Last 3 Encounters:  12/18/21 220 lb (99.8 kg)  07/04/21 220 lb 9.6 oz (100.1 kg)  12/07/20 223 lb (101.2 kg)    Physical Exam Vitals and nursing note reviewed.  Constitutional:      Appearance: He is well-developed.  HENT:     Head: Normocephalic and atraumatic.  Cardiovascular:     Rate and Rhythm: Normal rate and regular rhythm.     Heart sounds: Normal heart sounds. No murmur heard.    No friction rub. No gallop.  Pulmonary:     Effort: Pulmonary effort is normal. No tachypnea or respiratory distress.     Breath sounds: Normal breath sounds. No decreased breath sounds, wheezing, rhonchi or rales.  Chest:     Chest wall: No tenderness.  Abdominal:     General: Bowel sounds are normal.     Palpations: Abdomen is soft.  Musculoskeletal:        General: Normal range of motion.     Cervical back: Normal range of motion.  Feet:     Left foot:     Skin integrity: Warmth present.     Comments: Mild swelling to the left forefoot Skin:    General: Skin is warm and dry.  Neurological:     Mental Status: He is alert and oriented to person, place, and time.     Coordination: Coordination normal.  Psychiatric:        Behavior: Behavior normal. Behavior is cooperative.        Thought Content: Thought content normal.         Judgment: Judgment normal.          Patient has been counseled extensively about nutrition and exercise as well as the importance of adherence with medications and regular follow-up. The patient was given clear instructions to go to ER or return to medical center if symptoms don't improve, worsen or new problems  develop. The patient verbalized understanding.   Follow-up: Return in about 3 months (around 03/19/2022).   [name], FNP-BC [facility] [place], [place]   12/18/2021, 9:34 AM

## 2021-12-19 LAB — CMP14+EGFR
ALT: 21 IU/L (ref 0–44)
AST: 18 IU/L (ref 0–40)
Albumin/Globulin Ratio: 1.6 (ref 1.2–2.2)
Albumin: 4.5 g/dL (ref 3.8–4.9)
Alkaline Phosphatase: 59 IU/L (ref 44–121)
BUN/Creatinine Ratio: 13 (ref 9–20)
BUN: 16 mg/dL (ref 6–24)
Bilirubin Total: 0.3 mg/dL (ref 0.0–1.2)
CO2: 24 mmol/L (ref 20–29)
Calcium: 9.9 mg/dL (ref 8.7–10.2)
Chloride: 100 mmol/L (ref 96–106)
Creatinine, Ser: 1.2 mg/dL (ref 0.76–1.27)
Globulin, Total: 2.9 g/dL (ref 1.5–4.5)
Glucose: 107 mg/dL — ABNORMAL HIGH (ref 70–99)
Potassium: 5.2 mmol/L (ref 3.5–5.2)
Sodium: 137 mmol/L (ref 134–144)
Total Protein: 7.4 g/dL (ref 6.0–8.5)
eGFR: 71 mL/min/{1.73_m2} (ref >59)

## 2021-12-19 LAB — URIC ACID: Uric Acid: 7.5 mg/dL (ref 3.8–8.4)

## 2022-01-31 IMAGING — DX DG KNEE 1-2V*L*
3 series · 3 of 3 positions shown · non-contrast
Comparison: None.

CLINICAL DATA: Acute left knee pain and swelling. No known injury.
History of gout.

EXAM:
LEFT KNEE - 1-2 VIEW

[knee ap]
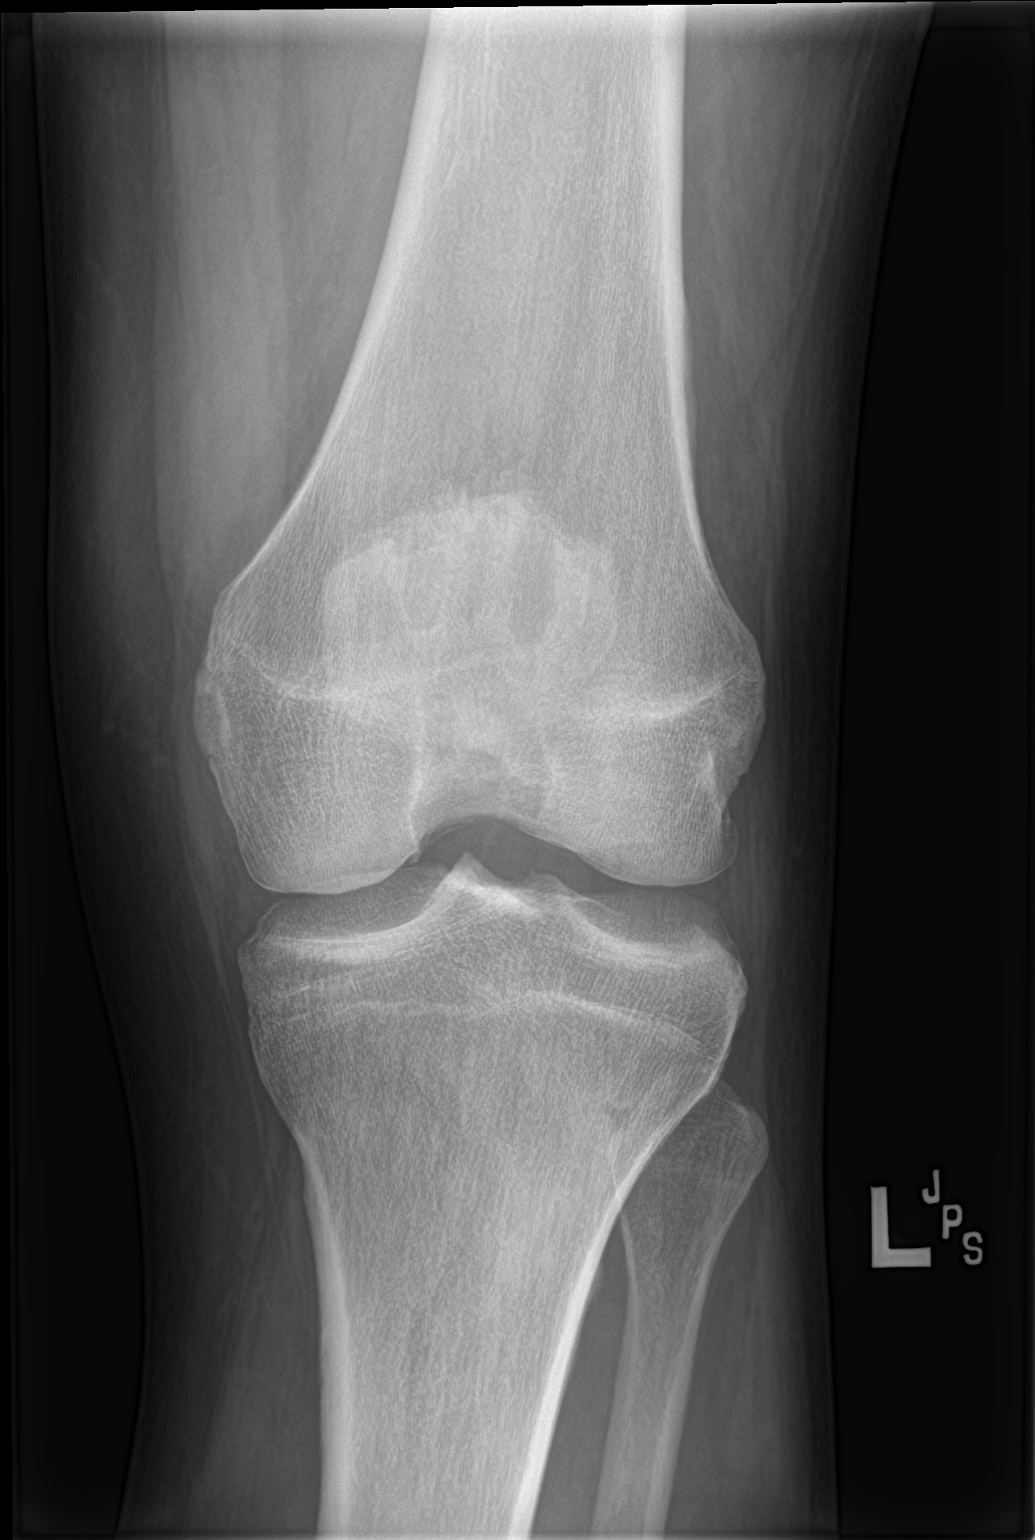

[knee lat (1 of 2)]
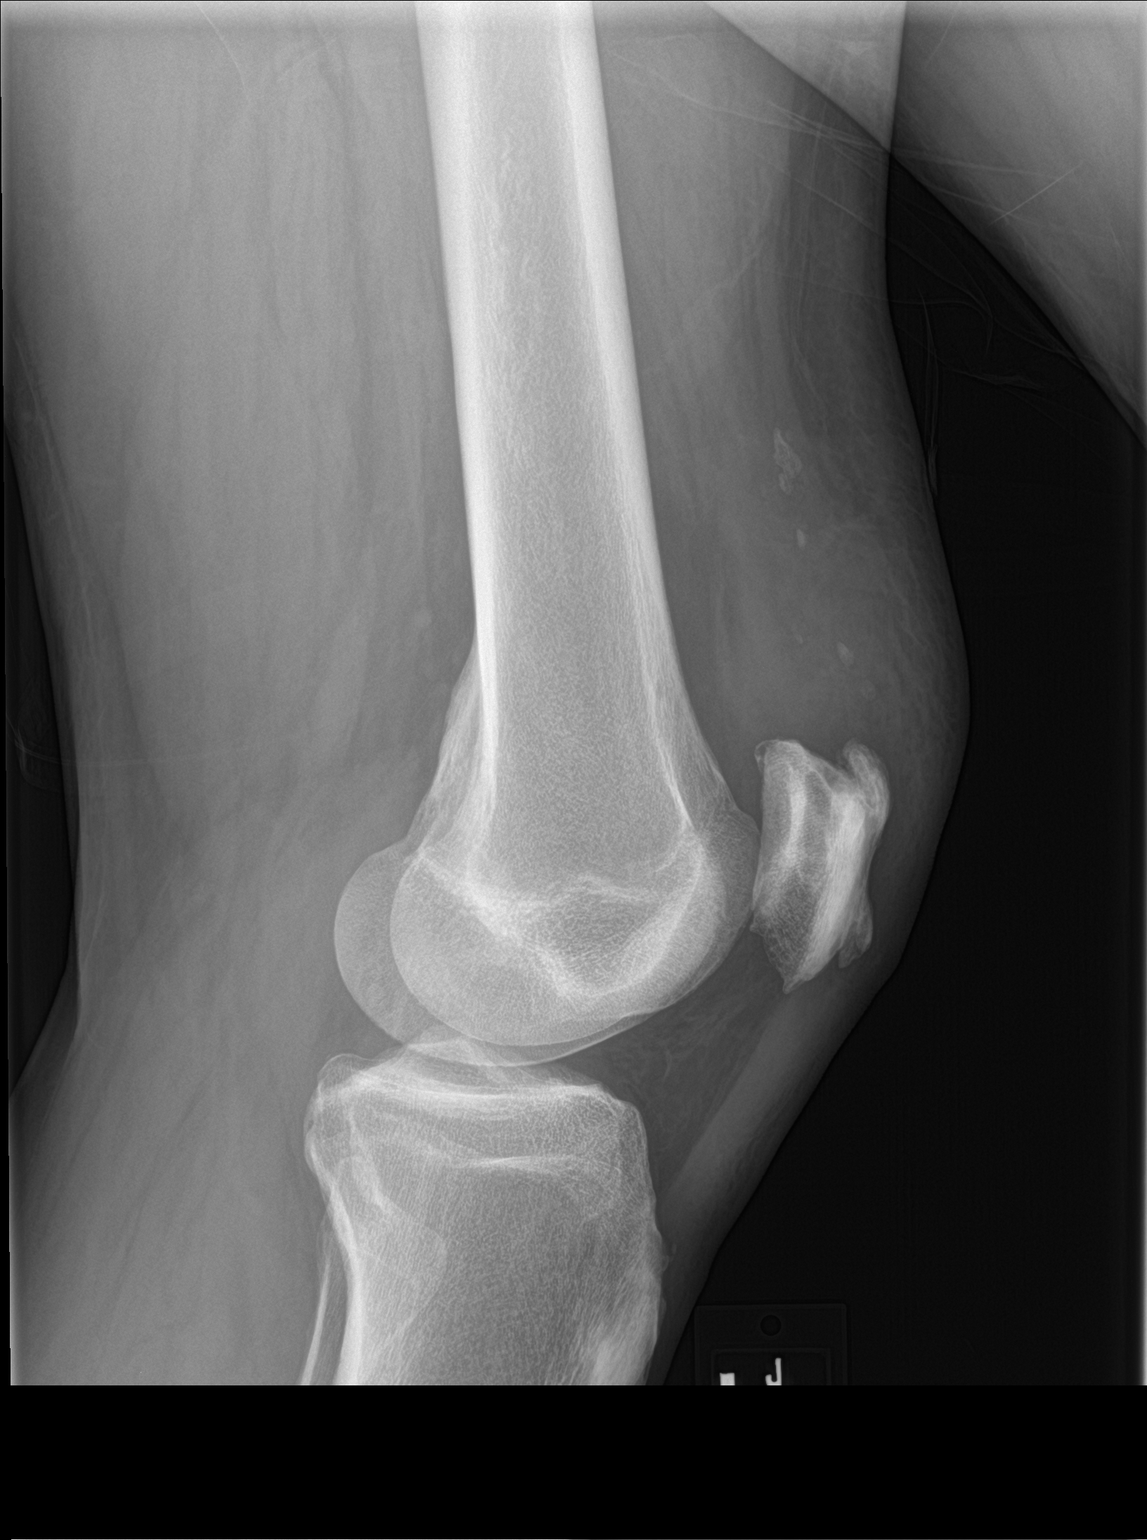

[knee lat (2 of 2)]
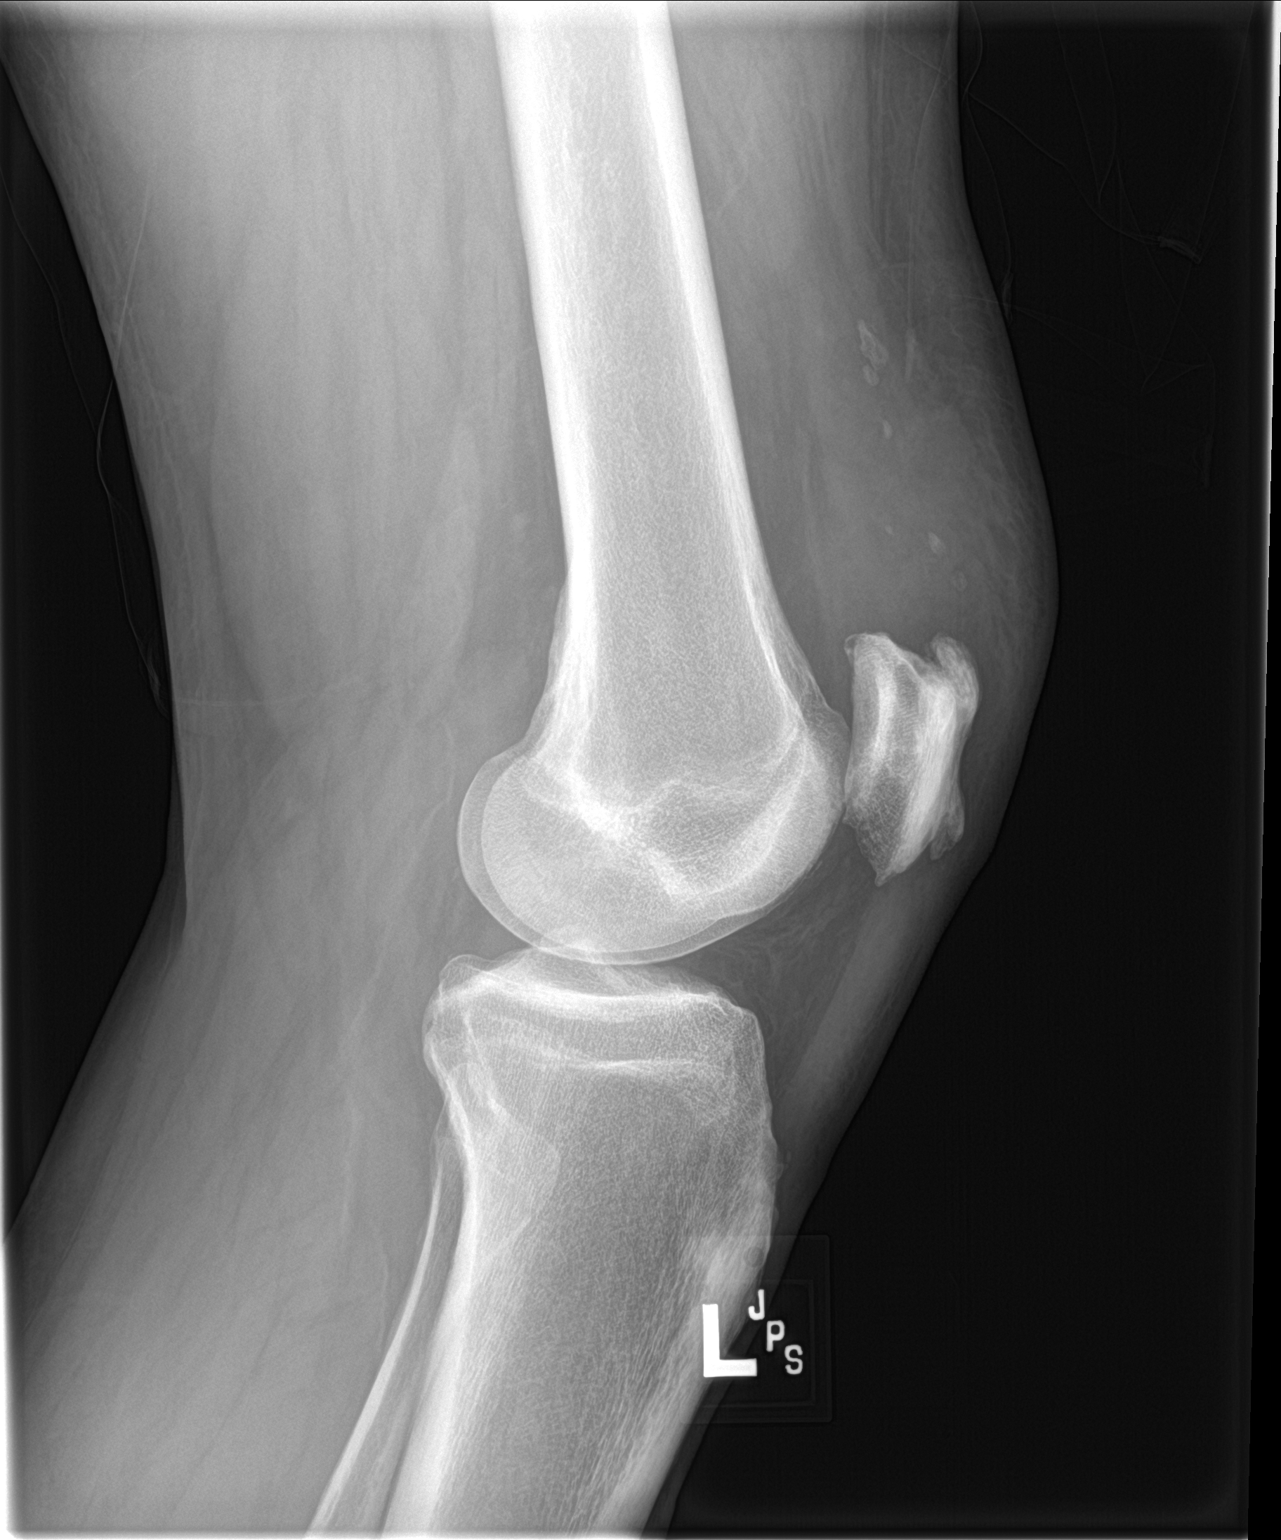

[3 of 3 positions shown; findings below may reference images not displayed]

FINDINGS: No fracture. Normal alignment. Joint spaces are preserved. Mild
patellofemoral peripheral spurring. No erosion or bone destruction.
Small knee joint effusion. Prominent quadriceps and patellar tendon
enthesophytes. There is soft tissue edema anterior and superior to
the patella, as well as indistinct fat planes adjacent to the distal
quadriceps tendon. Scattered soft tissue calcifications in the
region of soft tissue edema.
IMPRESSION: 1. Soft tissue edema anterior and superior to the patella with
indistinct fat planes adjacent to the distal quadriceps tendon, can
be seen in the setting of gout. Scattered soft tissue calcifications
in the region of soft tissue edema.
2. Small knee joint effusion.
3. Mild patellofemoral osteoarthritis. Prominent quadriceps and
patellar tendon enthesophytes.

## 2022-03-19 ENCOUNTER — Ambulatory Visit: Payer: Self-pay | Admitting: Nurse Practitioner

## 2022-03-26 ENCOUNTER — Ambulatory Visit: Payer: Self-pay | Admitting: Nurse Practitioner

## 2023-05-12 DIAGNOSIS — M109 Gout, unspecified: Secondary | ICD-10-CM | POA: Insufficient documentation

## 2023-06-03 ENCOUNTER — Telehealth: Payer: Self-pay

## 2023-06-03 NOTE — Telephone Encounter (Signed)
 Copied from CRM [id number]. Topic: General - Other >> Jun 03, 2023  9:31 AM Gaetano Hawthorne wrote: Reason for CRM: Patient was checking to see if we received his FMLA paperwork a few weeks ago - there's some additional paperwork as well - Patient stated that he will refax the paperwork in the next day or two.   E2C2 Agent did not find anything scanned in the Media tab or any mention of it being received in his chart.

## 2023-06-05 ENCOUNTER — Telehealth: Payer: Self-pay

## 2023-06-05 NOTE — Telephone Encounter (Signed)
 Paperwork has been received and patient has been called and scheduled an in person appointment to complete paperwork.      Copied from CRM [id number]. Topic: General - Other >> Jun 03, 2023  9:31 AM Gaetano Hawthorne wrote: Reason for CRM: Patient was checking to see if we received his FMLA paperwork a few weeks ago - there's some additional paperwork as well - Patient stated that he will refax the paperwork in the next day or two.   E2C2 Agent did not find anything scanned in the Media tab or any mention of it being received in his chart. >> Jun 05, 2023 12:25 PM Fredrica W wrote: Patient called back. States paperwork was refaxed yesterday 3/5. Not showing uploaded in chart. Patient would like a call back to confirm paper work has been received. Called [place] - no answer (lunch time)Thank You

## 2023-06-17 NOTE — Telephone Encounter (Signed)
 Unable to reach patient by phone. Patient needs appointment for forms to be filled out. Please contact me when patient returns call.

## 2023-07-01 ENCOUNTER — Ambulatory Visit: Payer: Self-pay | Admitting: Nurse Practitioner

## 2023-07-21 ENCOUNTER — Ambulatory Visit: Admitting: Nurse Practitioner

## 2023-08-06 ENCOUNTER — Encounter: Payer: Self-pay | Admitting: Nurse Practitioner

## 2023-08-06 ENCOUNTER — Ambulatory Visit: Attending: Nurse Practitioner | Admitting: Nurse Practitioner

## 2023-08-06 VITALS — BP 149/82 | HR 69 | Resp 19 | Ht 73.0 in | Wt 236.2 lb

## 2023-08-06 DIAGNOSIS — I10 Essential (primary) hypertension: Secondary | ICD-10-CM | POA: Diagnosis not present

## 2023-08-06 DIAGNOSIS — E782 Mixed hyperlipidemia: Secondary | ICD-10-CM

## 2023-08-06 DIAGNOSIS — N529 Male erectile dysfunction, unspecified: Secondary | ICD-10-CM | POA: Diagnosis not present

## 2023-08-06 DIAGNOSIS — M10042 Idiopathic gout, left hand: Secondary | ICD-10-CM | POA: Diagnosis not present

## 2023-08-06 DIAGNOSIS — R7309 Other abnormal glucose: Secondary | ICD-10-CM

## 2023-08-06 DIAGNOSIS — Z1211 Encounter for screening for malignant neoplasm of colon: Secondary | ICD-10-CM

## 2023-08-06 MED ORDER — SILDENAFIL CITRATE 100 MG PO TABS: 50.0000 mg | ORAL_TABLET | Freq: Every day | ORAL | 11 refills | Status: AC | PRN

## 2023-08-06 MED ORDER — LISINOPRIL 10 MG PO TABS
10.0000 mg | ORAL_TABLET | Freq: Every day | ORAL | 1 refills | Status: DC
Start: 2023-08-06 — End: 2023-08-12

## 2023-08-06 MED ORDER — AMLODIPINE BESYLATE 10 MG PO TABS: 10.0000 mg | ORAL_TABLET | Freq: Every day | ORAL | 1 refills | Status: DC

## 2023-08-06 MED ORDER — COLCHICINE 0.6 MG PO TABS: 0.6000 mg | ORAL_TABLET | Freq: Two times a day (BID) | ORAL | 0 refills | Status: AC

## 2023-08-06 MED ORDER — ATORVASTATIN CALCIUM 20 MG PO TABS: 20.0000 mg | ORAL_TABLET | Freq: Every day | ORAL | 3 refills | Status: AC

## 2023-08-06 MED ORDER — ALLOPURINOL 100 MG PO TABS: 100.0000 mg | ORAL_TABLET | Freq: Every day | ORAL | 0 refills | Status: AC

## 2023-08-06 NOTE — Progress Notes (Signed)
 FMLA for flare up of gout.

## 2023-08-06 NOTE — Patient Instructions (Signed)
 [facility] Located in: [place] [facility] [address] Address: [address], [place], [place] [address] Phone: [phone]

## 2023-08-06 NOTE — Progress Notes (Signed)
 Assessment & Plan:  Gary Villanueva was seen today for gout.  Diagnoses and all orders for this visit:  Primary hypertension Restart blood pressure medicines he has been without.  I have not seen him since 2023. -     CMP14+EGFR -     amLODipine  (NORVASC ) 10 MG tablet; Take 1 tablet (10 mg total) by mouth daily. -     lisinopril  (ZESTRIL ) 10 MG tablet; Take 1 tablet (10 mg total) by mouth daily.  Acute idiopathic gout of left hand Restart allopurinol  today. -     CBC with Differential -     Uric Acid -     Ambulatory referral to Gastroenterology -     allopurinol  (ZYLOPRIM ) 100 MG tablet; Take 1 tablet (100 mg total) by mouth daily. -     colchicine 0.6 MG tablet; Take 1 tablet (0.6 mg total) by mouth 2 (two) times daily for 7 days.  Mixed hyperlipidemia -     Lipid panel -     atorvastatin  (LIPITOR) 20 MG tablet; Take 1 tablet (20 mg total) by mouth daily. INSTRUCTIONS: Work on a low fat, heart healthy diet and participate in regular aerobic exercise program by working out at least 150 minutes per week; 5 days a week-30 minutes per day. Avoid red meat/beef/steak,  fried foods. junk foods, sodas, sugary drinks, unhealthy snacking, alcohol and smoking.  Drink at least 80 oz of water per day and monitor your carbohydrate intake daily.    Erectile dysfunction, unspecified erectile dysfunction type -     sildenafil  (VIAGRA ) 100 MG tablet; Take 0.5-1 tablets (50-100 mg total) by mouth daily as needed for erectile dysfunction.  Colon cancer screening -     Ambulatory referral to Gastroenterology  Elevated glucose -     Hemoglobin A1c    Patient has been counseled on age-appropriate routine health concerns for screening and prevention. These are reviewed and up-to-date. Referrals have been placed accordingly. Immunizations are up-to-date or declined.    Subjective:   Chief Complaint  Patient presents with   Gout    Gary Villanueva 58 y.o. male presents to office today for follow-up.   I have not seen him in several years.  He has a past medical history of Erectile dysfunction, Gout, and Hypertension.    Blood pressure is elevated today.  We will restart him on his amlodipine  and lisinopril . BP Readings from Last 3 Encounters:  08/06/23 (!) 149/82  12/18/21 (!) 165/94  07/04/21 (!) 150/95    He has a history of gout and poor adherence with taking allopurinol .  Today he reports a gout flare in his left wrist and hand.    Review of Systems  Constitutional:  Negative for fever, malaise/fatigue and weight loss.  HENT: Negative.  Negative for nosebleeds.   Eyes: Negative.  Negative for blurred vision, double vision and photophobia.  Respiratory: Negative.  Negative for cough and shortness of breath.   Cardiovascular: Negative.  Negative for chest pain, palpitations and leg swelling.  Gastrointestinal: Negative.  Negative for heartburn, nausea and vomiting.  Musculoskeletal:  Positive for joint pain. Negative for myalgias.  Neurological: Negative.  Negative for dizziness, focal weakness, seizures and headaches.  Psychiatric/Behavioral: Negative.  Negative for suicidal ideas.     Past Medical History:  Diagnosis Date   Erectile dysfunction    Gout    Hyperlipidemia    Hypertension     Past Surgical History:  Procedure Laterality Date   FOOT SURGERY  QUADRICEPS TENDON REPAIR      Family History  Problem Relation Age of Onset   Cancer Mother     Social History Reviewed with no changes to be made today.   Outpatient Medications Prior to Visit  Medication Sig Dispense Refill   amLODipine  (NORVASC ) 10 MG tablet Take 1 tablet (10 mg total) by mouth daily. (Patient not taking: Reported on 08/06/2023) 90 tablet 1   aspirin  EC 81 MG tablet Take 1 tablet (81 mg total) by mouth daily. (Patient not taking: Reported on 08/06/2023) 30 tablet 1   atorvastatin  (LIPITOR) 20 MG tablet Take 1 tablet (20 mg total) by mouth daily. (Patient not taking: Reported on 08/06/2023)  90 tablet 3   lisinopril  (ZESTRIL ) 10 MG tablet Take 1 tablet by mouth daily. (Patient not taking: Reported on 08/06/2023)     lisinopril  (ZESTRIL ) 20 MG tablet Take 1 tablet (20 mg total) by mouth daily. (Patient not taking: Reported on 08/06/2023) 30 tablet 2   sildenafil  (VIAGRA ) 100 MG tablet Take 0.5-1 tablets (50-100 mg total) by mouth daily as needed for erectile dysfunction. (Patient not taking: Reported on 08/06/2023) 10 tablet 11   No facility-administered medications prior to visit.    No Known Allergies     Objective:    BP (!) 149/82 (BP Location: Right Arm, Patient Position: Sitting, Cuff Size: Large)   Pulse 69   Resp 19   Ht 6\' 1"  (1.854 m)   Wt 236 lb 3.2 oz (107.1 kg)   SpO2 98%   BMI 31.16 kg/m  Wt Readings from Last 3 Encounters:  08/06/23 236 lb 3.2 oz (107.1 kg)  12/18/21 220 lb (99.8 kg)  07/04/21 220 lb 9.6 oz (100.1 kg)    Physical Exam Vitals and nursing note reviewed.  Constitutional:      Appearance: He is well-developed.  HENT:     Head: Normocephalic and atraumatic.  Cardiovascular:     Rate and Rhythm: Normal rate and regular rhythm.     Heart sounds: Normal heart sounds. No murmur heard.    No friction rub. No gallop.  Pulmonary:     Effort: Pulmonary effort is normal. No tachypnea or respiratory distress.     Breath sounds: Normal breath sounds. No decreased breath sounds, wheezing, rhonchi or rales.  Chest:     Chest wall: No tenderness.  Abdominal:     General: Bowel sounds are normal.     Palpations: Abdomen is soft.  Musculoskeletal:        General: Normal range of motion.     Cervical back: Normal range of motion.  Skin:    General: Skin is warm and dry.  Neurological:     Mental Status: He is alert and oriented to person, place, and time.     Coordination: Coordination normal.  Psychiatric:        Behavior: Behavior normal. Behavior is cooperative.        Thought Content: Thought content normal.        Judgment: Judgment normal.           Patient has been counseled extensively about nutrition and exercise as well as the importance of adherence with medications and regular follow-up. The patient was given clear instructions to go to ER or return to medical center if symptoms don't improve, worsen or new problems develop. The patient verbalized understanding.   Follow-up: Return for june 13th at 410.   Collins Dean, FNP-BC [facility] and [facility]  [place], [place] [phone]   08/06/2023, 4:49 PM

## 2023-08-07 LAB — CBC WITH DIFFERENTIAL/PLATELET
Basophils Absolute: 0 10*3/uL (ref 0.0–0.2)
Basos: 0 %
EOS (ABSOLUTE): 0.1 10*3/uL (ref 0.0–0.4)
Eos: 1 %
Hematocrit: 42 % (ref 37.5–51.0)
Hemoglobin: 13.9 g/dL (ref 13.0–17.7)
Immature Grans (Abs): 0 10*3/uL (ref 0.0–0.1)
Immature Granulocytes: 0 %
Lymphocytes Absolute: 2 10*3/uL (ref 0.7–3.1)
Lymphs: 43 %
MCH: 28.3 pg (ref 26.6–33.0)
MCHC: 33.1 g/dL (ref 31.5–35.7)
MCV: 85 fL (ref 79–97)
Monocytes Absolute: 0.5 10*3/uL (ref 0.1–0.9)
Monocytes: 10 %
Neutrophils Absolute: 2.1 10*3/uL (ref 1.4–7.0)
Neutrophils: 46 %
Platelets: 172 10*3/uL (ref 150–450)
RBC: 4.92 x10E6/uL (ref 4.14–5.80)
RDW: 12.5 % (ref 11.6–15.4)
WBC: 4.7 10*3/uL (ref 3.4–10.8)

## 2023-08-07 LAB — CMP14+EGFR
ALT: 28 IU/L (ref 0–44)
AST: 22 IU/L (ref 0–40)
Albumin: 4.4 g/dL (ref 3.8–4.9)
Alkaline Phosphatase: 66 IU/L (ref 44–121)
BUN/Creatinine Ratio: 12 (ref 9–20)
BUN: 15 mg/dL (ref 6–24)
Bilirubin Total: 0.4 mg/dL (ref 0.0–1.2)
CO2: 23 mmol/L (ref 20–29)
Calcium: 9.6 mg/dL (ref 8.7–10.2)
Chloride: 101 mmol/L (ref 96–106)
Creatinine, Ser: 1.26 mg/dL (ref 0.76–1.27)
Globulin, Total: 2.6 g/dL (ref 1.5–4.5)
Glucose: 85 mg/dL (ref 70–99)
Potassium: 4.9 mmol/L (ref 3.5–5.2)
Sodium: 139 mmol/L (ref 134–144)
Total Protein: 7 g/dL (ref 6.0–8.5)
eGFR: 66 mL/min/{1.73_m2} (ref >59)

## 2023-08-07 LAB — LIPID PANEL
Chol/HDL Ratio: 2.9 ratio (ref 0.0–5.0)
Cholesterol, Total: 241 mg/dL — ABNORMAL HIGH (ref 100–199)
HDL: 83 mg/dL (ref >39)
LDL Chol Calc (NIH): 146 mg/dL — ABNORMAL HIGH (ref 0–99)
Triglycerides: 69 mg/dL (ref 0–149)
VLDL Cholesterol Cal: 12 mg/dL (ref 5–40)

## 2023-08-07 LAB — URIC ACID: Uric Acid: 5.9 mg/dL (ref 3.8–8.4)

## 2023-08-07 LAB — HEMOGLOBIN A1C
Est. average glucose Bld gHb Est-mCnc: 126 mg/dL
Hgb A1c MFr Bld: 6 % — ABNORMAL HIGH (ref 4.8–5.6)

## 2023-08-12 ENCOUNTER — Ambulatory Visit
Admission: EM | Admit: 2023-08-12 | Discharge: 2023-08-12 | Disposition: A | Discharge status: Home / Self Care | Attending: Emergency Medicine | Admitting: Emergency Medicine

## 2023-08-12 DIAGNOSIS — I10 Essential (primary) hypertension: Secondary | ICD-10-CM | POA: Diagnosis not present

## 2023-08-12 DIAGNOSIS — R42 Dizziness and giddiness: Secondary | ICD-10-CM | POA: Diagnosis not present

## 2023-08-12 LAB — POCT FASTING CBG KUC MANUAL ENTRY: POCT Glucose (KUC): 106 mg/dL — AB (ref 70–99)

## 2023-08-12 MED ORDER — LISINOPRIL 10 MG PO TABS
10.0000 mg | ORAL_TABLET | Freq: Every day | ORAL | 1 refills | Status: DC
Start: 2023-08-12 — End: 2023-10-20

## 2023-08-12 MED ORDER — MECLIZINE HCL 50 MG PO TABS: 50.0000 mg | ORAL_TABLET | Freq: Two times a day (BID) | ORAL | 0 refills | Status: DC | PRN

## 2023-08-12 NOTE — ED Triage Notes (Signed)
 Patient presents to UC for dizziness x 4 days, dizziness worsened yesterday. 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 Noticed changing positions makes it worse. States hx of vertigo. Reports he has not taking his BP meds. Needs a work note.

## 2023-08-12 NOTE — Discharge Instructions (Addendum)
 The meclizine  is a higher dose, and can be taken twice a day. Be cautious if this makes you drowsy!  Take your amlodipine  and lisinopril  daily as prescribed   Please go to the emergency department if symptoms worsen or become severe. With any headache, worsening dizziness, vision changes, chest pain or pressure, shortness of breath, abdominal pain, weakness in extremities, be seen immediately.

## 2023-08-12 NOTE — ED Provider Notes (Signed)
 [facility]    CSN: [id number] Arrival date & time: 08/12/23  1658     History   Chief Complaint Chief Complaint  Patient presents with   Dizziness    HPI Gary Villanueva is a 58 y.o. male.  4 day history of vertigo Dizziness occurs with moving the head too quickly, or standing up quickly. He does not have symptoms at rest. There is no associated headache, vision changes, fever, neck stiffness, chest pain or tightness, shortness of breath, abdominal pain, or weakness of the extremities. He drives a forklift at work and the up-and-down motion brings on symptoms. He does have a history of vertigo, similar symptoms to this episode Tried some over-the-counter meclizine , 25 mg that helped a little  History of hypertension.  Takes amlodipine  and lisinopril .  Seen by primary care 6 days ago.  He has picked up his amlodipine  but the lisinopril  was not ready yet.  Has been off that for 4-5 days  Denies any head injury or fall  Past Medical History:  Diagnosis Date   Erectile dysfunction    Gout    Hyperlipidemia    Hypertension     Patient Active Problem List   Diagnosis Date Noted   Mixed hyperlipidemia 07/05/2020   Elevated random blood glucose level 07/05/2020   Acute pain of left knee 07/05/2020   Chronic gout    Essential hypertension     Past Surgical History:  Procedure Laterality Date   FOOT SURGERY     QUADRICEPS TENDON REPAIR         Home Medications    Prior to Admission medications   Medication Sig Start Date End Date Taking? Authorizing Provider  allopurinol  (ZYLOPRIM ) 100 MG tablet Take 1 tablet (100 mg total) by mouth daily. 08/06/23   Fleming, Zelda W, NP  amLODipine  (NORVASC ) 10 MG tablet Take 1 tablet (10 mg total) by mouth daily. 08/06/23   Fleming, Zelda W, NP  atorvastatin  (LIPITOR) 20 MG tablet Take 1 tablet (20 mg total) by mouth daily. 08/06/23   Collins Dean, NP  colchicine  0.6 MG tablet Take 1 tablet (0.6 mg total) by mouth 2  (two) times daily for 7 days. 08/06/23 08/13/23  Fleming, Zelda W, NP  lisinopril  (ZESTRIL ) 10 MG tablet Take 1 tablet (10 mg total) by mouth daily. 08/12/23   Markee Remlinger, Ivette Marks, PA-C  meclizine  (ANTIVERT ) 50 MG tablet Take 1 tablet (50 mg total) by mouth 2 (two) times daily as needed. 08/12/23  Yes Larren Copes, Ivette Marks, PA-C  sildenafil  (VIAGRA ) 100 MG tablet Take 0.5-1 tablets (50-100 mg total) by mouth daily as needed for erectile dysfunction. 08/06/23   Collins Dean, NP    Family History Family History  Problem Relation Age of Onset   Cancer Mother     Social History Social History   Tobacco Use   Smoking status: Never   Smokeless tobacco: Never  Substance Use Topics   Alcohol use: Yes    Alcohol/week: 2.0 standard drinks of alcohol    Types: 2 Cans of beer per week    Comment: occ   Drug use: No     Allergies   Patient has no known allergies.   Review of Systems Review of Systems Per HPI  Physical Exam Triage Vital Signs ED Triage Vitals [08/12/23 1709]  Encounter Vitals Group     BP (S) (!) 166/93     Systolic BP Percentile      Diastolic BP Percentile  Pulse Rate 73     Resp 16     Temp 98.4 F (36.9 C)     Temp Source Oral     SpO2 96 %     Weight      Height      Head Circumference      Peak Flow      Pain Score 0     Pain Loc      Pain Education      Exclude from Growth Chart    No data found.  Updated Vital Signs BP (!) 172/92 (BP Location: Left Arm)   Pulse 73   Temp 98.4 F (36.9 C) (Oral)   Resp 16   SpO2 96%    Physical Exam Vitals and nursing note reviewed.  Constitutional:      General: He is not in acute distress. HENT:     Head: Normocephalic and atraumatic.     Right Ear: Tympanic membrane and ear canal normal.     Left Ear: Tympanic membrane and ear canal normal.     Nose: Nose normal.     Mouth/Throat:     Mouth: Mucous membranes are moist.     Pharynx: Oropharynx is clear.  Eyes:     General: Vision grossly intact. Gaze  aligned appropriately.     Extraocular Movements: Extraocular movements intact.     Right eye: Normal extraocular motion and no nystagmus.     Left eye: Normal extraocular motion and no nystagmus.     Conjunctiva/sclera: Conjunctivae normal.     Pupils: Pupils are equal, round, and reactive to light.  Neck:     Comments: No bony tenderness C spine. Full ROM of neck Cardiovascular:     Rate and Rhythm: Normal rate and regular rhythm.     Pulses:          Radial pulses are 2+ on the right side and 2+ on the left side.       Dorsalis pedis pulses are 2+ on the right side and 2+ on the left side.     Heart sounds: Normal heart sounds.  Pulmonary:     Effort: Pulmonary effort is normal. No respiratory distress.     Breath sounds: Normal breath sounds.  Abdominal:     Palpations: Abdomen is soft.     Tenderness: There is no abdominal tenderness.  Musculoskeletal:        General: Normal range of motion.     Cervical back: Normal range of motion. No rigidity or tenderness.  Lymphadenopathy:     Cervical: No cervical adenopathy.  Neurological:     General: No focal deficit present.     Mental Status: He is alert and oriented to person, place, and time.     Cranial Nerves: Cranial nerves 2-12 are intact. No cranial nerve deficit or facial asymmetry.     Sensory: Sensation is intact.     Motor: Motor function is intact. No weakness, tremor or pronator drift.     Coordination: Coordination is intact. Romberg sign negative. Finger-Nose-Finger Test normal.     Gait: Gait is intact.     Comments: Strength and sensation intact and equal. Negative romberg and pronator drift. Gait steady. CN 2-12 intact     UC Treatments / Results  Labs (all labs ordered are listed, but only abnormal results are displayed) Labs Reviewed  POCT FASTING CBG [facility] MANUAL ENTRY - Abnormal; Notable for the following components:      Result Value  POCT Glucose ([facility]) 106 (*)    All other components within normal  limits    EKG  Radiology No results found.  Procedures Procedures   Medications Ordered in UC Medications - No data to display  Initial Impression / Assessment and Plan / UC Course  I have reviewed the triage vital signs and the nursing notes.  Pertinent labs & imaging results that were available during my care of the patient were reviewed by me and considered in my medical decision making (see chart for details).  CBG 106 Overall well appearing, neuro exam intact  Discussion with patient, elevated BP 172/92 and dizziness, he really should be evaluated in the emergency department.  However he is neurologically intact, symptoms only occur with motion of head, and has personal history of vertigo very similar to these symptoms.  Discussed with patient I am comfortable having him try 50 mg meclizine  twice daily as needed and monitoring symptoms.  I have re-sent lisinopril  to his pharmacy tonight.  We have discussed very strict emergency department precautions for any new or worsening symptoms.  Discussed any changes could be related to his hypertension and need immediate evaluation.  Patient verbalizes understanding.  A note for work is provided  Final Clinical Impressions(s) / UC Diagnoses   Final diagnoses:  Vertigo  Essential hypertension     Discharge Instructions      The meclizine  is a higher dose, and can be taken twice a day. Be cautious if this makes you drowsy!  Take your amlodipine  and lisinopril  daily as prescribed   Please go to the emergency department if symptoms worsen or become severe. With any headache, worsening dizziness, vision changes, chest pain or pressure, shortness of breath, abdominal pain, weakness in extremities, be seen immediately.  ED Prescriptions     Medication Sig Dispense Auth. Provider   meclizine  (ANTIVERT ) 50 MG tablet Take 1 tablet (50 mg total) by mouth 2 (two) times daily as needed. 30 tablet Caitlain Tweed, PA-C   lisinopril   (ZESTRIL ) 10 MG tablet Take 1 tablet (10 mg total) by mouth daily. 90 tablet Janete Quilling, Ivette Marks, PA-C      PDMP not reviewed this encounter.   Tandre Conly, Beth Brooke 08/12/23 1824

## 2023-08-14 ENCOUNTER — Ambulatory Visit: Payer: Self-pay | Admitting: Nurse Practitioner

## 2023-08-14 ENCOUNTER — Telehealth: Payer: Self-pay | Admitting: Nurse Practitioner

## 2023-08-14 NOTE — Telephone Encounter (Signed)
 Unable to reach patient by phone to relay response. Voicemail left with detailed response per DPR. Patient notified of 7-10 business day turn around for paper work. I also stated that someone for our office will reach out to him once the forms are completed.

## 2023-08-14 NOTE — Telephone Encounter (Signed)
 Copied from CRM [id number]. Topic: General - Other >> Aug 14, 2023  2:34 PM Gary Villanueva T wrote:  Reason for CRM: patient called to f/u on his FMLA paperwork. Per CAL nothing has been done. Please f/u with patient to let him know when he can come get his papers as he had his appt on 5/7 and is running up on the deadline.

## 2023-08-14 NOTE — Telephone Encounter (Signed)
 Copied from CRM [id number]. Topic: Medical Record Request - Other >> Aug 14, 2023  2:27 PM Juluis Ok wrote:  Reason for CRM: Patient is requesting to have FMLA paperwork faxed to his supervisor . He states this was discussed with provider during May 7th appointment.  Fax # [phone] Wenona Hamilton, Supervisor

## 2023-08-20 NOTE — Telephone Encounter (Signed)
 FYI

## 2023-08-20 NOTE — Telephone Encounter (Signed)
 Patient called back to f/u on his FMLA paperwork as his supervisor is asking again. He has come up on his deadline and today is his 10th business day since he dropped off the form. Please f/u with patient because his jobs depends on the forms.

## 2023-08-21 NOTE — Telephone Encounter (Signed)
Noted, provider is aware

## 2023-09-11 ENCOUNTER — Encounter: Payer: Self-pay | Admitting: Nurse Practitioner

## 2023-09-12 ENCOUNTER — Ambulatory Visit: Admitting: Nurse Practitioner

## 2023-10-17 ENCOUNTER — Telehealth: Payer: Self-pay | Admitting: Nurse Practitioner

## 2023-10-17 NOTE — Telephone Encounter (Signed)
Contacted pt confirmed appt

## 2023-10-20 ENCOUNTER — Encounter: Payer: Self-pay | Admitting: Nurse Practitioner

## 2023-10-20 ENCOUNTER — Ambulatory Visit: Attending: Nurse Practitioner | Admitting: Nurse Practitioner

## 2023-10-20 ENCOUNTER — Other Ambulatory Visit: Payer: Self-pay

## 2023-10-20 ENCOUNTER — Other Ambulatory Visit: Payer: Self-pay | Admitting: Nurse Practitioner

## 2023-10-20 VITALS — BP 160/98 | HR 65 | Resp 20 | Ht 73.0 in | Wt 236.6 lb

## 2023-10-20 DIAGNOSIS — I10 Essential (primary) hypertension: Secondary | ICD-10-CM | POA: Diagnosis not present

## 2023-10-20 DIAGNOSIS — Z23 Encounter for immunization: Secondary | ICD-10-CM

## 2023-10-20 DIAGNOSIS — R42 Dizziness and giddiness: Secondary | ICD-10-CM

## 2023-10-20 DIAGNOSIS — N529 Male erectile dysfunction, unspecified: Secondary | ICD-10-CM

## 2023-10-20 MED ORDER — MECLIZINE HCL 50 MG PO TABS
50.0000 mg | ORAL_TABLET | Freq: Two times a day (BID) | ORAL | 1 refills | Status: AC | PRN
  Filled 2023-10-20: qty 30, 15d supply, fill #0

## 2023-10-20 MED ORDER — AMLODIPINE BESYLATE 10 MG PO TABS
10.0000 mg | ORAL_TABLET | Freq: Every day | ORAL | 1 refills | Status: AC
  Filled 2023-10-20: qty 30, 30d supply, fill #0

## 2023-10-20 MED ORDER — LISINOPRIL 20 MG PO TABS
20.0000 mg | ORAL_TABLET | Freq: Every day | ORAL | 6 refills | Status: AC
  Filled 2023-10-20: qty 30, 30d supply, fill #0

## 2023-10-20 NOTE — Progress Notes (Unsigned)
 Assessment & Plan:  [name] was seen today for hypertension.  Diagnoses and all orders for this visit:  Primary hypertension -     lisinopril  (ZESTRIL ) 20 MG tablet; Take 1 tablet (20 mg total) by mouth daily. -     amLODipine  (NORVASC ) 10 MG tablet; Take 1 tablet (10 mg total) by mouth daily. Uncontrolled hypertension with recent readings of 177/105 mmHg. Ran out of medication four days ago. Current regimen includes maximum dose amlodipine  and lisinopril , which will be increased today. Asymptomatic but at stroke risk. - Increase lisinopril  to 20 mg daily. - Instruct to monitor blood pressure at home 2-3 times a week. - If blood pressure remains elevated after one week on increased lisinopril , increase lisinopril  by an additional 10 mg and notify provider for a new prescription. - If blood pressure stabilizes, consider changing to a 90-day supply upon request.  Need for shingles vaccine -     Zoster, Recombinant (Shingrix )  Need for Tdap vaccination -     Tdap vaccine greater than or equal to 7yo IM  Dizziness -     meclizine  (ANTIVERT ) 50 MG tablet; Take 1 tablet (50 mg total) by mouth 2 (two) times daily as needed. Dizziness improved. Meclizine  used for management.    Follow-up Follow-up plans discussed for ongoing management of hypertension and other conditions. - Schedule follow-up visit to reassess blood pressure and other conditions. - Ensure he knows how to access and use MyChart for communication.  Patient has been counseled on age-appropriate routine health concerns for screening and prevention. These are reviewed and up-to-date. Referrals have been placed accordingly. Immunizations are up-to-date or declined.    Subjective:   Chief Complaint  Patient presents with   Hypertension   History of Present Illness Gary Villanueva is a 58 year old male with hypertension who presents for blood pressure management.   HTN He has not been monitoring his blood pressure at  home despite having a blood pressure monitoring device. He was taking his medications regularly until he ran out of both medications four days ago. He attempted to save one pill for the day, in hopes his blood pressure would be normal. No headaches or chest pain reported today. BP Readings from Last 3 Encounters:  10/20/23 (!) 160/98  08/12/23 (!) 172/92  08/06/23 (!) 149/82      He has chronic dizziness, which has improved recently.    Regarding his gout, he has not experienced any recent flare-ups. He is currently on allopurinol  and taking has prescribed, he also has colchicine  which he keeps for potential flare-ups.    ROS  Past Medical History:  Diagnosis Date   Erectile dysfunction    Gout    Hyperlipidemia    Hypertension     Past Surgical History:  Procedure Laterality Date   FOOT SURGERY     QUADRICEPS TENDON REPAIR      Family History  Problem Relation Age of Onset   Cancer Mother     Social History Reviewed with no changes to be made today.   Outpatient Medications Prior to Visit  Medication Sig Dispense Refill   allopurinol  (ZYLOPRIM ) 100 MG tablet Take 1 tablet (100 mg total) by mouth daily. 90 tablet 0   atorvastatin  (LIPITOR) 20 MG tablet Take 1 tablet (20 mg total) by mouth daily. 90 tablet 3   sildenafil  (VIAGRA ) 100 MG tablet Take 0.5-1 tablets (50-100 mg total) by mouth daily as needed for erectile dysfunction. 10 tablet 11  amLODipine  (NORVASC ) 10 MG tablet Take 1 tablet (10 mg total) by mouth daily. 90 tablet 1   lisinopril  (ZESTRIL ) 10 MG tablet Take 1 tablet (10 mg total) by mouth daily. 90 tablet 1   meclizine  (ANTIVERT ) 50 MG tablet Take 1 tablet (50 mg total) by mouth 2 (two) times daily as needed. 30 tablet 0   colchicine  0.6 MG tablet Take 1 tablet (0.6 mg total) by mouth 2 (two) times daily for 7 days. (Patient not taking: Reported on 10/20/2023) 14 tablet 0   No facility-administered medications prior to visit.    No Known Allergies      Objective:    BP (!) 160/98 (BP Location: Left Arm, Patient Position: Sitting, Cuff Size: Normal)   Pulse 65   Resp 20   Ht 6' 1 (1.854 m)   Wt 236 lb 9.6 oz (107.3 kg)   SpO2 100%   BMI 31.22 kg/m  Wt Readings from Last 3 Encounters:  10/20/23 236 lb 9.6 oz (107.3 kg)  08/06/23 236 lb 3.2 oz (107.1 kg)  12/18/21 220 lb (99.8 kg)    Physical Exam Vitals and nursing note reviewed.  Constitutional:      Appearance: He is well-developed.  HENT:     Head: Normocephalic and atraumatic.  Cardiovascular:     Rate and Rhythm: Normal rate and regular rhythm.     Heart sounds: Normal heart sounds. No murmur heard.    No friction rub. No gallop.  Pulmonary:     Effort: Pulmonary effort is normal. No tachypnea or respiratory distress.     Breath sounds: Normal breath sounds. No decreased breath sounds, wheezing, rhonchi or rales.  Chest:     Chest wall: No tenderness.  Abdominal:     General: Bowel sounds are normal.     Palpations: Abdomen is soft.  Musculoskeletal:        General: Normal range of motion.     Right elbow: Deformity present.       Arms:     Cervical back: Normal range of motion.     Comments: Painless nodule on back of elbow. He states this was evaluated by an orthopedist in the past however he declined surgery at that time.   Skin:    General: Skin is warm and dry.  Neurological:     Mental Status: He is alert and oriented to person, place, and time.     Coordination: Coordination normal.  Psychiatric:        Behavior: Behavior normal. Behavior is cooperative.        Thought Content: Thought content normal.        Judgment: Judgment normal.          Patient has been counseled extensively about nutrition and exercise as well as the importance of adherence with medications and regular follow-up. The patient was given clear instructions to go to ER or return to medical center if symptoms don't improve, worsen or new problems develop. The patient  verbalized understanding.   Follow-up: Return in about 3 months (around 01/20/2024).   Haze LELON Servant, FNP-BC [facility] [place], [place] [phone]   10/20/2023, 4:44 PM

## 2023-10-21 ENCOUNTER — Other Ambulatory Visit: Payer: Self-pay

## 2024-01-20 ENCOUNTER — Ambulatory Visit: Admitting: Nurse Practitioner
